# Patient Record
Sex: Male | Born: 1990 | Race: Black or African American | Hispanic: No | Marital: Single | Smoking: Never smoker
Health system: Southern US, Community
[De-identification: ages and names within clinical notes are randomized; demographics above are authoritative.]

## PROBLEM LIST (undated history)

## (undated) DIAGNOSIS — E119 Type 2 diabetes mellitus without complications: Secondary | ICD-10-CM

## (undated) DIAGNOSIS — F39 Unspecified mood [affective] disorder: Secondary | ICD-10-CM

## (undated) DIAGNOSIS — J45909 Unspecified asthma, uncomplicated: Secondary | ICD-10-CM

## (undated) HISTORY — PX: WISDOM TOOTH EXTRACTION: SHX21

---

## 2017-01-18 ENCOUNTER — Other Ambulatory Visit: Payer: Self-pay | Admitting: Student

## 2017-01-18 DIAGNOSIS — M25511 Pain in right shoulder: Secondary | ICD-10-CM

## 2017-01-18 DIAGNOSIS — R200 Anesthesia of skin: Secondary | ICD-10-CM

## 2017-01-25 ENCOUNTER — Ambulatory Visit
Admission: RE | Admit: 2017-01-25 | Discharge: 2017-01-25 | Disposition: A | Discharge status: Home / Self Care | Payer: Managed Care, Other (non HMO) | Source: Ambulatory Visit | Attending: Student | Admitting: Student

## 2017-01-25 DIAGNOSIS — R200 Anesthesia of skin: Secondary | ICD-10-CM

## 2017-01-25 DIAGNOSIS — M25511 Pain in right shoulder: Secondary | ICD-10-CM

## 2017-02-20 ENCOUNTER — Emergency Department (HOSPITAL_COMMUNITY)
Admission: EM | Admit: 2017-02-20 | Discharge: 2017-02-21 | Disposition: A | Discharge status: Home / Self Care | Payer: Managed Care, Other (non HMO) | Attending: Emergency Medicine | Admitting: Emergency Medicine

## 2017-02-20 ENCOUNTER — Encounter (HOSPITAL_COMMUNITY): Payer: Self-pay | Admitting: Family Medicine

## 2017-02-20 DIAGNOSIS — G479 Sleep disorder, unspecified: Secondary | ICD-10-CM | POA: Insufficient documentation

## 2017-02-20 DIAGNOSIS — R4589 Other symptoms and signs involving emotional state: Secondary | ICD-10-CM

## 2017-02-20 MED ORDER — HYDROXYZINE HCL 25 MG PO TABS: 25.0000 mg | ORAL_TABLET | Freq: Four times a day (QID) | ORAL | 0 refills | Status: DC

## 2017-02-20 NOTE — ED Triage Notes (Signed)
Patent is calm and cooperative. Appears in no acute distress. Answers questions appropriately.

## 2017-02-20 NOTE — ED Provider Notes (Signed)
[facility]-EMERGENCY DEPT Provider Note   CSN: [id number] Arrival date & time: 02/20/17  1845     History   Chief Complaint Chief Complaint  Patient presents with  . Anxiety    HPI Ricky [name] is a 27 y.o. male.  HPI   Ricky [name] is a 27 y.o. male, patient with no pertinent past medical history, presenting to the ED for assistance with "feeling off." States he woke up this morning feeling upset and crying.  States he has felt off all day, but this has improved as the day progressed.  He has not felt this way before.  Denies SI/HI or A/V hallucinations.  Does endorse feeling overwhelmed with extra life stressors recently, causing poor sleep.  Endorses a strong support system.  Denies regular alcohol or illicit drug use.     History reviewed. No pertinent past medical history.  There are no active problems to display for this patient.   History reviewed. No pertinent surgical history.     Home Medications    Prior to Admission medications   Medication Sig Start Date End Date Taking? Authorizing Provider  hydrOXYzine (ATARAX/VISTARIL) 25 MG tablet Take 1 tablet (25 mg total) by mouth every 6 (six) hours. 02/20/17   Jody Silas, Hillard Danker, PA-C    Family History Family History  Problem Relation Age of Onset  . Bipolar disorder Mother   . Schizophrenia Maternal Grandmother     Social History Social History   Tobacco Use  . Smoking status: Never Smoker  . Smokeless tobacco: Never Used  Substance Use Topics  . Alcohol use: No    Frequency: Never  . Drug use: No     Allergies   Patient has no known allergies.   Review of Systems Review of Systems  Neurological: Negative for dizziness, syncope, weakness, light-headedness, numbness and headaches.  Psychiatric/Behavioral: Positive for dysphoric mood and sleep disturbance. Negative for hallucinations and suicidal ideas.  All other systems reviewed and are negative.    Physical  Exam Updated Vital Signs BP (!) 153/84 (BP Location: Left Arm)   Pulse 74   Temp 98.4 F (36.9 C) (Oral)   Resp 18   Ht 5\' 11"  (1.803 m)   Wt 115.7 kg (255 lb)   SpO2 100%   BMI 35.57 kg/m   Physical Exam  Constitutional: He appears well-developed and well-nourished. No distress.  HENT:  Head: Normocephalic and atraumatic.  Eyes: Conjunctivae are normal.  Neck: Neck supple.  Cardiovascular: Normal rate and regular rhythm.  Pulmonary/Chest: Effort normal.  Neurological: He is alert.  Skin: Skin is warm and dry. He is not diaphoretic. No pallor.  Psychiatric: He has a normal mood and affect. His behavior is normal.  Patient appears calm.  Makes appropriate eye contact.  Answers questions appropriately.  Nursing note and vitals reviewed.    ED Treatments / Results  Labs (all labs ordered are listed, but only abnormal results are displayed) Labs Reviewed - No data to display  EKG  EKG Interpretation None       Radiology No results found.  Procedures Procedures (including critical care time)  Medications Ordered in ED Medications - No data to display   Initial Impression / Assessment and Plan / ED Course  I have reviewed the triage vital signs and the nursing notes.  Pertinent labs & imaging results that were available during my care of the patient were reviewed by me and considered in my medical decision making (see chart for details).  Patient presents with feelings of dysphoria today.  Denies SI.  Suspect sleep dysfunction may also be a factor.  Recommend outpatient counseling follow-up.  Resources given. The patient was given instructions for home care as well as return precautions. Patient voices understanding of these instructions, accepts the plan, and is comfortable with discharge.  Final Clinical Impressions(s) / ED Diagnoses   Final diagnoses:  Dysphoric mood  Sleep disturbance    ED Discharge Orders        Ordered    hydrOXYzine  (ATARAX/VISTARIL) 25 MG tablet  Every 6 hours     02/20/17 2349       Anselm PancoastJoy, Lazarius Rivkin C, PA-C 02/20/17 2352    Molpus, Jonny RuizJohn, MD 02/21/17 [phone]

## 2017-02-20 NOTE — Discharge Instructions (Signed)
Please follow-up with an outpatient counselor or mental health professional. Try to systematically reduce stress.  Get at least 8 hours of sleep, if possible.  Be sure to stay well-hydrated by drinking at least 72 ounces of water a day.   May use the hydroxyzine, as needed, for anxiety or to assist with sleep.  Do not drive or perform other dangerous activities while taking the hydroxyzine. Return to the ED immediately should you begin to have thoughts of suicide or self-harm.

## 2017-02-20 NOTE — ED Triage Notes (Signed)
Patient reports he woke this morning upset, crying and felt that way all day. Reports he is a little anxious. Patient denies feeling suicidal or homicidal. He reports he has had no recent change in life and reports he has a strong support system.

## 2017-04-09 ENCOUNTER — Encounter (HOSPITAL_COMMUNITY): Payer: Self-pay | Admitting: Emergency Medicine

## 2017-04-09 ENCOUNTER — Emergency Department (HOSPITAL_COMMUNITY)
Admission: EM | Admit: 2017-04-09 | Discharge: 2017-04-09 | Disposition: A | Discharge status: Home Health | Payer: Managed Care, Other (non HMO) | Attending: Emergency Medicine | Admitting: Emergency Medicine

## 2017-04-09 ENCOUNTER — Other Ambulatory Visit: Payer: Self-pay

## 2017-04-09 DIAGNOSIS — F329 Major depressive disorder, single episode, unspecified: Secondary | ICD-10-CM | POA: Insufficient documentation

## 2017-04-09 DIAGNOSIS — Z79899 Other long term (current) drug therapy: Secondary | ICD-10-CM | POA: Insufficient documentation

## 2017-04-09 DIAGNOSIS — R4589 Other symptoms and signs involving emotional state: Secondary | ICD-10-CM

## 2017-04-09 NOTE — ED Provider Notes (Signed)
[facility]-EMERGENC[facility] DEPT Provider Note   CSN: [id number] Arrival date & time: 04/09/17  0016     History   Chief Complaint Chief Complaint  Patient presents with  . Depression    HPI Ricky [name] is a 27 y.o. male.  The history is provided by the patient and medical records.  Depression     27 year old male here with depression.  He was seen here about a month ago for similar symptoms and was given outpatient resources but never followed up.  Reports his symptoms have been coming in waves of the past month or so.  States some days he is okay, other days he just feels very "down" and sad.  States there is not been anything necessarily traumatic or any added stress that he feels is contributing to this.  States he does not do so I know what to do to make himself "snap out of it".  Cousin is at bedside and states that some days he is very quiet and withdrawn but he has never made any threats to harm himself in any way.  Some days he will get in his car and just drive to try to "escapt his problems" per cousin.  Patient does not disclose and actual "problems" he feels he is having.  Patient does deny any former or current suicidal thoughts.  No homicidal ideation.  No hallucinations.  Denies any significant drug or alcohol abuse that may be contributing.  States he did try to call one of the facilities on the paperwork he was given from prior ED visit but states it was told that it would be 3 months before he get an appointment so he never made one.  States his sleep has been irregular, some days he sleeps almost all day, other days hardly at all.  States he has been able to eat and drink normally.  Patient has no documented psychiatric history.  History reviewed. No pertinent past medical history.  There are no active problems to display for this patient.   History reviewed. No pertinent surgical history.     Home Medications    Prior to Admission  medications   Medication Sig Start Date End Date Taking? Authorizing Provider  hydrOXYzine (ATARAX/VISTARIL) 25 MG tablet Take 1 tablet (25 mg total) by mouth every 6 (six) hours. Patient not taking: Reported on 04/09/2017 02/20/17   Anselm Pancoast, PA-C    Family History Family History  Problem Relation Age of Onset  . Bipolar disorder Mother   . Schizophrenia Maternal Grandmother     Social History Social History   Tobacco Use  . Smoking status: Never Smoker  . Smokeless tobacco: Never Used  Substance Use Topics  . Alcohol use: Yes    Frequency: Never    Comment: occ  . Drug use: No     Allergies   Patient has no known allergies.   Review of Systems Review of Systems  Psychiatric/Behavioral: Positive for depression.       Depression  All other systems reviewed and are negative.    Physical Exam Updated Vital Signs BP (!) 147/94 (BP Location: Left Arm)   Pulse 96   Temp 98.5 F (36.9 C) (Oral)   Resp 14   Ht 5\' 10"  (1.778 m)   Wt 113.4 kg (250 lb)   SpO2 98%   BMI 35.87 kg/m   Physical Exam  Constitutional: He is oriented to person, place, and time. He appears well-developed and well-nourished.  HENT:  Head: Normocephalic and atraumatic.  Mouth/Throat: Oropharynx is clear and moist.  Eyes: Conjunctivae and EOM are normal. Pupils are equal, round, and reactive to light.  Neck: Normal range of motion.  Cardiovascular: Normal rate, regular rhythm and normal heart sounds.  Pulmonary/Chest: Effort normal and breath sounds normal.  Abdominal: Soft. Bowel sounds are normal.  Musculoskeletal: Normal range of motion.  Neurological: He is alert and oriented to person, place, and time.  Skin: Skin is warm and dry.  Psychiatric: He has a normal mood and affect.  Depressed mood, flat affect, not very forthcoming with details Denies SI/HI/AVH  Nursing note and vitals reviewed.    ED Treatments / Results  Labs (all labs ordered are listed, but only abnormal  results are displayed) Labs Reviewed - No data to display  EKG  EKG Interpretation None       Radiology No results found.  Procedures Procedures (including critical care time)  Medications Ordered in ED Medications - No data to display   Initial Impression / Assessment and Plan / ED Course  I have reviewed the triage vital signs and the nursing notes.  Pertinent labs & imaging results that were available during my care of the patient were reviewed by me and considered in my medical decision making (see chart for details).  27 year old male here with depressive type symptoms.  Reports this is been intermittent over the past month but cannot pinpoint any provoking factors or recent events that are causing these feelings.  He denies any suicidal homicidal ideation.  No hallucinations.  Patient has no documented psychiatric history.  His symptoms are rather vague, stating some days he is sad and others he feels okay.  The only behavior his cousin seems concerned about is that he is sometimes sleeping more than normal and will get in his car and drive around.  It does not appear that he has had any attempts at self-harm.  At this time, I do not feel the patient is a danger to himself or others at this time.  I do feel he would benefit from seeing a psychiatrist and possibly being started on some medications, however do not feel he needs inpatient psychiatric care.  He was given resources for the behavioral health Hospital and encouraged to try to get evaluation later this week.  Discussed with patient as well as his cousin who remains at bedside that should symptoms worsen and he develop any suicidal homicidal ideation, hallucinations, or other alarming symptoms he should return to the ED.  Final Clinical Impressions(s) / ED Diagnoses   Final diagnoses:  Feeling of sadness    ED Discharge Orders    None       Garlon HatchetSanders, Ila Landowski M, PA-C 04/09/17 0427    Dione BoozeGlick, David, MD 04/09/17  [phone]

## 2017-04-09 NOTE — ED Notes (Signed)
ED Provider at bedside. 

## 2017-04-09 NOTE — ED Triage Notes (Signed)
Pt states he feels like he is going to have a mental breakdown  Pt states he was seen here about a month or so for same and was given some outpt resources but he has not been to see anyone  Pt states the symptoms started getting worse Friday  Pt denies SI/HI at this time

## 2017-04-09 NOTE — Discharge Instructions (Signed)
Would recommend to try and follow-up with behavioral health hospital.  If you cannot be seen there, can try some of the other residential services. If you begin to feel suicidal, homicidal, or hallucinating please return to the ED.

## 2017-04-09 NOTE — ED Notes (Signed)
Pt feels depressed/lack of motivation. Denies SI or HI.

## 2017-04-10 ENCOUNTER — Ambulatory Visit (HOSPITAL_COMMUNITY)
Admission: AD | Admit: 2017-04-10 | Discharge: 2017-04-10 | Disposition: A | Discharge status: Home / Self Care | Payer: Managed Care, Other (non HMO) | Attending: Psychiatry | Admitting: Psychiatry

## 2017-04-10 DIAGNOSIS — Z818 Family history of other mental and behavioral disorders: Secondary | ICD-10-CM | POA: Insufficient documentation

## 2017-04-10 DIAGNOSIS — F330 Major depressive disorder, recurrent, mild: Secondary | ICD-10-CM | POA: Insufficient documentation

## 2017-04-10 NOTE — H&P (Signed)
Behavioral Health Medical Screening Exam  Ricky GarrisonCameron Dragone is an 27 y.o. male. [facility] due to exacerbated depressive feelings over the last several months. The patient denies any prior use of psychotropics and or diagnosis of depression. He denies SI/SA or HI, He denies use of illicit drugs but has been drinking more frequently. He denies any acute and or chronic health concerns.  Total Time spent with patient: 20 minutes  Psychiatric Specialty Exam: Physical Exam  Constitutional: He is oriented to person, place, and time. He appears well-developed and well-nourished. No distress.  HENT:  Head: Normocephalic.  Eyes: Pupils are equal, round, and reactive to light.  Respiratory: Effort normal and breath sounds normal. No respiratory distress.  Neurological: He is alert and oriented to person, place, and time. No cranial nerve deficit.  Skin: Skin is warm and dry. He is not diaphoretic.  Psychiatric: His speech is normal. Judgment normal. He is withdrawn. Cognition and memory are normal. He exhibits a depressed mood. He expresses no homicidal and no suicidal ideation.    Review of Systems  Constitutional: Negative for chills, diaphoresis, fever, malaise/fatigue and weight loss.  Respiratory: Negative for cough and shortness of breath.   Cardiovascular: Negative for chest pain.  Neurological: Negative for seizures and loss of consciousness.  Psychiatric/Behavioral: Positive for depression. Negative for hallucinations, substance abuse and suicidal ideas. The patient is nervous/anxious.   All other systems reviewed and are negative.   There were no vitals taken for this visit.There is no height or weight on file to calculate BMI.  General Appearance: Casual  Eye Contact:  Fair  Speech:  Clear and Coherent  Volume:  Decreased  Mood:  Depressed  Affect:  Congruent  Thought Process:  Goal Directed  Orientation:  Full (Time, Place, and Person)  Thought Content:   Logical  Suicidal Thoughts:  No  Homicidal Thoughts:  No  Memory:  Immediate;   Good  Judgement:  Fair  Insight:  Fair  Psychomotor Activity:  Normal  Concentration: Concentration: Fair  Recall:  Fair  Fund of Knowledge:Fair  Language: Good  Akathisia:  Negative  Handed:  Right  AIMS (if indicated):     Assets:  Desire for Improvement  Sleep:       Musculoskeletal: Strength & Muscle Tone: within normal limits Gait & Station: normal Patient leans: N/A  There were no vitals taken for this visit.  Recommendations:  Based on my evaluation the patient does not appear to have an emergency medical condition.  Kerry HoughSpencer E Olaoluwa Grieder, PA-C 04/10/2017, 5:16 AM

## 2017-04-10 NOTE — BH Assessment (Signed)
Assessment Note  Ricky [name] is an 27 y.o. male who presents voluntarily to [facility] alone reporting symptoms of depression.  Pt reports feeling down for the past 3 months. Pt has a history of suicidal thoughts as a teenager. Pt denies being on any medications.  Pt denies current suicidal and denies having a plan. Pt denies past attempts.   Pt acknowledges symptoms of depression including: sadness, fatigue, low self esteem, tearfulness, isolating, lack of motivation, anger, irritability, negative outlook, difficulty concentrating, helplessness and hopelessness. and occasional nightmares.  Pt also reports symptoms of anxiety including: excessive worry, restlessness and having a panic attack about 1 month ago.  Pt denies homicidal ideation/ history of violence. Pt denies auditory or visual hallucinations or other psychotic symptoms. Pt denies having any current stressors include.   Pt lives with a roomate, and supports include family and friends. Pt denies history of abuse and trauma. Pt reports there is a family history of MH and denies family history of SI/SA. Pt states he is currently enrolled in college and is employed. Pt has fair insight and partial judgment. Pt's memory is intact.  Pt denies having a legal history.  Pt denies OP/IP history  Pt reports alcohol use last week and substance use of marijuana 2 years ago.  Pt is casually dressed, alert, oriented x4 with normal, slow and soft speech and normal motor behavior. Eye contact is fair. Pt's mood is depressed and affect is depressed and flat. Affect is congruent with mood. Thought process is coherent and relevant. There is no indication Pt is currently responding to internal stimuli or experiencing delusional thought content. Pt was cooperative throughout assessment. Pt is able to contract for safety outside the hospital.  Diagnosis: F33.0 Major depressive disorder, Recurrent episode, Mild  Past Medical History: No past medical history  on file.  No past surgical history on file.  Family History:  Family History  Problem Relation Age of Onset  . Bipolar disorder Mother   . Schizophrenia Maternal Grandmother     Social History:  reports that  has never smoked. he has never used smokeless tobacco. He reports that he drinks alcohol. He reports that he does not use drugs.  Additional Social History:  Alcohol / Drug Use Pain Medications: See MAR Prescriptions: See MAR Over the Counter: See MAR History of alcohol / drug use?: Yes Substance #1 Name of Substance 1: Marijuana 1 - Age of First Use: 14 1 - Amount (size/oz): Unknown 1 - Frequency: Stopped 1 - Duration: Stopped 2 years ago 1 - Last Use / Amount: 2 years ago, unknown amount Substance #2 Name of Substance 2: Alcohol 2 - Age of First Use: 20 2 - Amount (size/oz): Unknown 2 - Frequency: Occassionally 2 - Duration: Ongoing 2 - Last Use / Amount: Last Thursday  CIWA:   COWS:    Allergies: No Known Allergies  Home Medications:  (Not in a hospital admission)  OB/GYN Status:  No LMP for male patient.  General Assessment Data Location of Assessment: [facility] Assessment Services TTS Assessment: In system Is this a Tele or Face-to-Face Assessment?: Face-to-Face Is this an Initial Assessment or a Re-assessment for this encounter?: Initial Assessment Marital status: Single Maiden name: NA Is patient pregnant?: No Pregnancy Status: No Living Arrangements: Non-relatives/Friends(Pt shares apartment with roomate) Can pt return to current living arrangement?: Yes Admission Status: Voluntary Is patient capable of signing voluntary admission?: Yes Referral Source: Self/Family/Friend Insurance type: Medical sales representative     Crisis Care Plan Living Arrangements:  Non-relatives/Friends(Pt shares apartment with roomate)  Education Status Is patient currently in school?: Yes Current Grade: [employer] Senior  Risk to self with the past 6 months Suicidal Ideation: No Has  patient been a risk to self within the past 6 months prior to admission? : No Suicidal Intent: No Has patient had any suicidal intent within the past 6 months prior to admission? : No Is patient at risk for suicide?: No Suicidal Plan?: No Has patient had any suicidal plan within the past 6 months prior to admission? : No Access to Means: No What has been your use of drugs/alcohol within the last 12 months?: Pt reports using alcohol last Thursday Previous Attempts/Gestures: No How many times?: 0 Other Self Harm Risks: Pt denies Triggers for Past Attempts: None known Intentional Self Injurious Behavior: None Family Suicide History: No Recent stressful life event(s): Other (Comment)(Pt denies) Persecutory voices/beliefs?: No Depression: Yes Depression Symptoms: Despondent, Insomnia, Tearfulness, Isolating, Fatigue, Loss of interest in usual pleasures, Feeling worthless/self pity, Feeling angry/irritable Substance abuse history and/or treatment for substance abuse?: Yes Suicide prevention information given to non-admitted patients: Yes  Risk to Others within the past 6 months Homicidal Ideation: No Does patient have any lifetime risk of violence toward others beyond the six months prior to admission? : No Thoughts of Harm to Others: No Current Homicidal Intent: No Current Homicidal Plan: No Access to Homicidal Means: No Identified Victim: Pt denies History of harm to others?: No Assessment of Violence: None Noted Violent Behavior Description: Pt denies Does patient have access to weapons?: No Criminal Charges Pending?: No Does patient have a court date: No Is patient on probation?: No  Psychosis Hallucinations: None noted Delusions: None noted  Mental Status Report Appearance/Hygiene: Unremarkable Eye Contact: Fair Motor Activity: Freedom of movement Speech: Logical/coherent, Soft, Slow Level of Consciousness: Alert, Quiet/awake Mood: Depressed Affect: Depressed,  Flat Anxiety Level: Minimal Thought Processes: Coherent, Relevant Judgement: Partial Orientation: Person, Place, Time, Situation, Appropriate for developmental age Obsessive Compulsive Thoughts/Behaviors: None  Cognitive Functioning Concentration: Normal Memory: Recent Intact, Remote Intact IQ: Average Insight: Fair Impulse Control: Fair Appetite: Good Weight Loss: 0 Weight Gain: 0 Sleep: Decreased Total Hours of Sleep: 4 Vegetative Symptoms: Staying in bed  ADLScreening [facility] Assessment [facility]) Patient's cognitive ability adequate to safely complete daily activities?: Yes Patient able to express need for assistance with ADLs?: Yes Independently performs ADLs?: Yes (appropriate for developmental age)  Prior Inpatient Therapy Prior Inpatient Therapy: No  Prior Outpatient Therapy Prior Outpatient Therapy: No Does patient have an ACCT team?: No Does patient have Intensive In-House Services?  : No Does patient have [facility] services? : No Does patient have P4CC services?: No  ADL Screening (condition at time of admission) Patient's cognitive ability adequate to safely complete daily activities?: Yes Is the patient deaf or have difficulty hearing?: No Does the patient have difficulty seeing, even when wearing glasses/contacts?: No Does the patient have difficulty concentrating, remembering, or making decisions?: No Patient able to express need for assistance with ADLs?: Yes Does the patient have difficulty dressing or bathing?: No Independently performs ADLs?: Yes (appropriate for developmental age) Does the patient have difficulty walking or climbing stairs?: No Weakness of Legs: None Weakness of Arms/Hands: None  Home Assistive Devices/Equipment Home Assistive Devices/Equipment: None    Abuse/Neglect Assessment (Assessment to be complete while patient is alone) Abuse/Neglect Assessment Can Be Completed: Yes Physical Abuse: Denies Verbal Abuse: Denies Sexual Abuse:  Denies Exploitation of patient/patient's resources: Denies Self-Neglect: Denies     Advance  Directives (For Healthcare) Does Patient Have a Medical Advance Directive?: No Would patient like information on creating a medical advance directive?: No - Patient declined    Additional Information 1:1 In Past 12 Months?: No CIRT Risk: No Elopement Risk: No Does patient have medical clearance?: No     Disposition: Gave clinical report to Donell Sievert, PA who recommends discharge with OPT resources.  Disposition Initial Assessment Completed for this Encounter: Yes Disposition of Patient: Discharge with Outpatient Resources  On Site Evaluation by:   Reviewed with Physician:    Annamaria Boots, MS, [facility] Therapeutic Triage Specialist  Annamaria Boots 04/10/2017 5:19 AM

## 2019-11-10 ENCOUNTER — Emergency Department (HOSPITAL_BASED_OUTPATIENT_CLINIC_OR_DEPARTMENT_OTHER)
Admission: EM | Admit: 2019-11-10 | Discharge: 2019-11-10 | Disposition: A | Discharge status: Home / Self Care | Payer: Self-pay | Attending: Emergency Medicine | Admitting: Emergency Medicine

## 2019-11-10 ENCOUNTER — Encounter (HOSPITAL_BASED_OUTPATIENT_CLINIC_OR_DEPARTMENT_OTHER): Payer: Self-pay

## 2019-11-10 ENCOUNTER — Other Ambulatory Visit: Payer: Self-pay

## 2019-11-10 DIAGNOSIS — Z20822 Contact with and (suspected) exposure to covid-19: Secondary | ICD-10-CM | POA: Insufficient documentation

## 2019-11-10 DIAGNOSIS — L539 Erythematous condition, unspecified: Secondary | ICD-10-CM | POA: Insufficient documentation

## 2019-11-10 DIAGNOSIS — J45909 Unspecified asthma, uncomplicated: Secondary | ICD-10-CM | POA: Insufficient documentation

## 2019-11-10 DIAGNOSIS — J029 Acute pharyngitis, unspecified: Secondary | ICD-10-CM | POA: Insufficient documentation

## 2019-11-10 HISTORY — DX: Unspecified asthma, uncomplicated: J45.909

## 2019-11-10 LAB — GROUP A STREP BY PCR: Group A Strep by PCR: NOT DETECTED

## 2019-11-10 MED ORDER — LIDOCAINE VISCOUS HCL 2 % MT SOLN
15.0000 mL | Freq: Once | OROMUCOSAL | Status: AC
  Administered 2019-11-10: 15 mL via OROMUCOSAL
  Filled 2019-11-10: qty 15

## 2019-11-10 MED ORDER — IBUPROFEN 400 MG PO TABS
600.0000 mg | ORAL_TABLET | Freq: Once | ORAL | Status: AC
  Administered 2019-11-10: 18:00:00 600 mg via ORAL
  Filled 2019-11-10: qty 1

## 2019-11-10 NOTE — ED Provider Notes (Signed)
[facility] EMERGENCY DEPARTMENT Provider Note   CSN: [id number] Arrival date & time: 11/10/19  1514     History Chief Complaint  Patient presents with  . Sore Throat    Robbie Rideaux is a 29 y.o. male.  Latavion Halls is a 29 y.o. male with a history of asthma, who presents to the emergency department for evaluation of sore throat which has been present for a little over a week.  He states that he was seen at urgent care last week after symptoms initially began and had a negative Covid test, no other testing was done.  He has continued to have a sore throat that is worse with swallowing, but he has been able to eat and drink normally.  He denies any associated rhinorrhea, cough, chest pain, shortness of breath.  He states that he had some brief ear pain today but thought that that was more so due to wearing his mask.  No fevers, chills, myalgias, headache, nausea, vomiting, diarrhea or abdominal pain.  No known sick contacts.  He has been using many over-the-counter cold and flu medications, the only thing that has given him much relief is ibuprofen.        Past Medical History:  Diagnosis Date  . Asthma     There are no problems to display for this patient.   Past Surgical History:  Procedure Laterality Date  . WISDOM TOOTH EXTRACTION         Family History  Problem Relation Age of Onset  . Bipolar disorder Mother   . Schizophrenia Maternal Grandmother     Social History   Tobacco Use  . Smoking status: Never Smoker  . Smokeless tobacco: Never Used  Vaping Use  . Vaping Use: Never used  Substance Use Topics  . Alcohol use: Yes    Comment: occ  . Drug use: No    Home Medications Prior to Admission medications   Medication Sig Start Date End Date Taking? Authorizing Provider  hydrOXYzine (ATARAX/VISTARIL) 25 MG tablet Take 1 tablet (25 mg total) by mouth every 6 (six) hours. Patient not taking: Reported on 04/09/2017 02/20/17   Anselm Pancoast, PA-C    Allergies    Patient has no known allergies.  Review of Systems   Review of Systems  Constitutional: Negative for chills and fever.  HENT: Positive for sore throat. Negative for congestion, rhinorrhea, trouble swallowing and voice change.   Respiratory: Negative for cough and shortness of breath.   Cardiovascular: Negative for chest pain.  Gastrointestinal: Negative for abdominal pain, diarrhea, nausea and vomiting.  Musculoskeletal: Negative for myalgias, neck pain and neck stiffness.  Neurological: Negative for headaches.  All other systems reviewed and are negative.   Physical Exam Updated Vital Signs BP (!) 139/94 (BP Location: Left Arm)   Pulse 100   Temp 98.3 F (36.8 C) (Oral)   Resp 15   Ht 5\' 10"  (1.778 m)   Wt 112 kg   SpO2 100%   BMI 35.44 kg/m   Physical Exam Vitals and nursing note reviewed.  Constitutional:      General: He is not in acute distress.    Appearance: He is well-developed. He is obese. He is not ill-appearing or diaphoretic.     Comments: Well-appearing and in no distress  HENT:     Head: Normocephalic and atraumatic.     Right Ear: Tympanic membrane and ear canal normal.     Left Ear: Tympanic membrane and ear canal  normal.     Nose: Congestion present. No rhinorrhea.     Comments: Mild congestion noted without rhinorrhea    Mouth/Throat:     Mouth: Mucous membranes are moist.     Pharynx: Posterior oropharyngeal erythema present.     Tonsils: No tonsillar exudate or tonsillar abscesses.     Comments: Posterior oropharynx clear and mucous membranes moist, there is mild erythema but no edema or tonsillar exudates, uvula midline, normal phonation, no trismus, tolerating secretions without difficulty. Eyes:     General:        Right eye: No discharge.        Left eye: No discharge.  Neck:     Comments: No rigidity Cardiovascular:     Rate and Rhythm: Normal rate and regular rhythm.     Heart sounds: Normal heart sounds.    Pulmonary:     Effort: Pulmonary effort is normal. No respiratory distress.     Breath sounds: Normal breath sounds.     Comments: Respirations equal and unlabored, patient able to speak in full sentences, lungs clear to auscultation bilaterally Abdominal:     General: Bowel sounds are normal. There is no distension.     Palpations: Abdomen is soft. There is no mass.     Tenderness: There is no abdominal tenderness. There is no guarding.     Comments: Abdomen soft, nondistended, nontender to palpation in all quadrants without guarding or peritoneal signs  Musculoskeletal:        General: No deformity.     Cervical back: Neck supple.  Lymphadenopathy:     Cervical: No cervical adenopathy.  Skin:    General: Skin is warm and dry.     Capillary Refill: Capillary refill takes less than 2 seconds.  Neurological:     Mental Status: He is alert and oriented to person, place, and time.  Psychiatric:        Mood and Affect: Mood normal.        Behavior: Behavior normal.     ED Results / Procedures / Treatments   Labs (all labs ordered are listed, but only abnormal results are displayed) Labs Reviewed  GROUP A STREP BY PCR    EKG None  Radiology No results found.  Procedures Procedures (including critical care time)  Medications Ordered in ED Medications  ibuprofen (ADVIL) tablet 600 mg (has no administration in time range)  lidocaine (XYLOCAINE) 2 % viscous mouth solution 15 mL (has no administration in time range)    ED Course  I have reviewed the triage vital signs and the nursing notes.  Pertinent labs & imaging results that were available during my care of the patient were reviewed by me and considered in my medical decision making (see chart for details).    MDM Rules/Calculators/A&P                          Pt afebrile without tonsillar exudate, negative strep. Presents with mild cervical lymphadenopathy, & dysphagia; diagnosis of viral pharyngitis.  Had  negative Covid test at urgent care.  No abx indicated. Discharged with symptomatic tx for pain  Pt does not appear dehydrated, but did discuss importance of water rehydration. Presentation non concerning for PTA or RPA. No trismus or uvula deviation. Specific return precautions discussed. Pt able to drink water in ED without difficulty with intact air way. Recommended PCP follow up.  Final Clinical Impression(s) / ED Diagnoses Final diagnoses:  Viral pharyngitis  Rx / DC Orders ED Discharge Orders    None       Dartha Lodge, [place] 11/10/19 1818    Rolan Bucco, MD 11/10/19 2342

## 2019-11-10 NOTE — ED Triage Notes (Signed)
Painful swallowing/sore throat x 1 week, seen at [facility] on Tuesday, tested negative for COVID-19, no other tests done. No fever, congestion/runny nose, or body aches. Still has tonsils.

## 2019-11-10 NOTE — Discharge Instructions (Signed)
Your strep test was negative, suspect symptoms are due to viral upper respiratory infection.  You can treat symptoms with ibuprofen, Tylenol and over-the-counter's epical throat lozenges.  Make sure you are staying very well-hydrated.  If symptoms or not improving or you develop new or worsening symptoms return or follow-up with your PCP for reevaluation.

## 2021-03-17 DIAGNOSIS — E1165 Type 2 diabetes mellitus with hyperglycemia: Secondary | ICD-10-CM | POA: Insufficient documentation

## 2021-05-30 ENCOUNTER — Other Ambulatory Visit: Payer: Self-pay

## 2021-05-30 ENCOUNTER — Emergency Department (HOSPITAL_BASED_OUTPATIENT_CLINIC_OR_DEPARTMENT_OTHER)
Admission: EM | Admit: 2021-05-30 | Discharge: 2021-05-30 | Disposition: A | Discharge status: Home / Self Care | Payer: 59 | Attending: Emergency Medicine | Admitting: Emergency Medicine

## 2021-05-30 ENCOUNTER — Encounter (HOSPITAL_BASED_OUTPATIENT_CLINIC_OR_DEPARTMENT_OTHER): Payer: Self-pay | Admitting: Emergency Medicine

## 2021-05-30 ENCOUNTER — Emergency Department (HOSPITAL_BASED_OUTPATIENT_CLINIC_OR_DEPARTMENT_OTHER): Payer: 59

## 2021-05-30 DIAGNOSIS — E1165 Type 2 diabetes mellitus with hyperglycemia: Secondary | ICD-10-CM | POA: Diagnosis not present

## 2021-05-30 DIAGNOSIS — J4541 Moderate persistent asthma with (acute) exacerbation: Secondary | ICD-10-CM | POA: Insufficient documentation

## 2021-05-30 DIAGNOSIS — R0789 Other chest pain: Secondary | ICD-10-CM | POA: Diagnosis present

## 2021-05-30 HISTORY — DX: Type 2 diabetes mellitus without complications: E11.9

## 2021-05-30 LAB — CBC WITH DIFFERENTIAL/PLATELET
Abs Immature Granulocytes: 0.02 10*3/uL (ref 0.00–0.07)
Basophils Absolute: 0 10*3/uL (ref 0.0–0.1)
Basophils Relative: 1 %
Eosinophils Absolute: 0.1 10*3/uL (ref 0.0–0.5)
Eosinophils Relative: 1 %
HCT: 41.1 % (ref 39.0–52.0)
Hemoglobin: 13.7 g/dL (ref 13.0–17.0)
Immature Granulocytes: 0 %
Lymphocytes Relative: 45 %
Lymphs Abs: 3.6 10*3/uL (ref 0.7–4.0)
MCH: 27 pg (ref 26.0–34.0)
MCHC: 33.3 g/dL (ref 30.0–36.0)
MCV: 81.1 fL (ref 80.0–100.0)
Monocytes Absolute: 0.5 10*3/uL (ref 0.1–1.0)
Monocytes Relative: 6 %
Neutro Abs: 3.8 10*3/uL (ref 1.7–7.7)
Neutrophils Relative %: 47 %
Platelets: 330 10*3/uL (ref 150–400)
RBC: 5.07 MIL/uL (ref 4.22–5.81)
RDW: 13.2 % (ref 11.5–15.5)
WBC: 7.9 10*3/uL (ref 4.0–10.5)
nRBC: 0 % (ref 0.0–0.2)

## 2021-05-30 LAB — BASIC METABOLIC PANEL
Anion gap: 10 (ref 5–15)
BUN: 11 mg/dL (ref 6–20)
CO2: 28 mmol/L (ref 22–32)
Calcium: 9.7 mg/dL (ref 8.9–10.3)
Chloride: 96 mmol/L — ABNORMAL LOW (ref 98–111)
Creatinine, Ser: 0.95 mg/dL (ref 0.61–1.24)
GFR, Estimated: >60 mL/min (ref >60)
Glucose, Bld: 436 mg/dL — ABNORMAL HIGH (ref 70–99)
Potassium: 3.5 mmol/L (ref 3.5–5.1)
Sodium: 134 mmol/L — ABNORMAL LOW (ref 135–145)

## 2021-05-30 LAB — CBG MONITORING, ED
Glucose-Capillary: 332 mg/dL — ABNORMAL HIGH (ref 70–99)
Glucose-Capillary: 250 mg/dL — ABNORMAL HIGH (ref 70–99)

## 2021-05-30 LAB — TROPONIN I (HIGH SENSITIVITY)
Troponin I (High Sensitivity): 3 ng/L (ref <18)
Troponin I (High Sensitivity): 3 ng/L (ref <18)

## 2021-05-30 MED ORDER — INSULIN ASPART 100 UNIT/ML IJ SOLN
5.0000 [IU] | Freq: Once | INTRAMUSCULAR | Status: AC
  Administered 2021-05-30: 5 [IU] via SUBCUTANEOUS

## 2021-05-30 MED ORDER — ALBUTEROL SULFATE HFA 108 (90 BASE) MCG/ACT IN AERS
2.0000 | INHALATION_SPRAY | RESPIRATORY_TRACT | Status: DC | PRN
Start: 2021-05-30 — End: 2021-05-30
  Administered 2021-05-30: 2 via RESPIRATORY_TRACT
  Filled 2021-05-30: qty 6.7

## 2021-05-30 MED ORDER — DEXAMETHASONE 4 MG PO TABS
10.0000 mg | ORAL_TABLET | Freq: Once | ORAL | Status: AC
  Administered 2021-05-30: 10 mg via ORAL
  Filled 2021-05-30: qty 3

## 2021-05-30 MED ORDER — SODIUM CHLORIDE 0.9 % IV BOLUS
1000.0000 mL | Freq: Once | INTRAVENOUS | Status: AC
  Administered 2021-05-30: 1000 mL via INTRAVENOUS

## 2021-05-30 MED ORDER — INSULIN REGULAR HUMAN 100 UNIT/ML IJ SOLN: 5.0000 [IU] | Freq: Once | INTRAMUSCULAR | Status: DC

## 2021-05-30 MED ORDER — ASPIRIN 81 MG PO CHEW
324.0000 mg | CHEWABLE_TABLET | Freq: Once | ORAL | Status: AC
  Administered 2021-05-30: 324 mg via ORAL
  Filled 2021-05-30: qty 4

## 2021-05-30 MED ORDER — INSULIN ASPART 100 UNIT/ML IJ SOLN
10.0000 [IU] | Freq: Once | INTRAMUSCULAR | Status: AC
Start: 2021-05-30 — End: 2021-05-30
  Administered 2021-05-30: 10 [IU] via SUBCUTANEOUS

## 2021-05-30 NOTE — ED Notes (Signed)
Pt ambulatory with steady gait to restroom 

## 2021-05-30 NOTE — ED Provider Notes (Signed)
? ?[facility]-EMERGENCY DEPT ?Provider Note: Ricky Dell, MD, FACEP ? ?CSN: [id number] ?MRN: [id number] ?ARRIVAL: 05/30/21 at 0521 ?ROOM: [facility]09/[facility]09 ? ? ?CHIEF COMPLAINT  ?Chest Pain ? ? ?HISTORY OF PRESENT ILLNESS  ?05/30/21 5:38 AM ?Ricky [name] is a 31 y.o. male with a history of asthma as a child and diabetes as an adult.  He is here with 2 to 3 days of chest discomfort.  The chest discomfort is vaguely characterized but is described as like a pressure in his anterior chest or a sensation that he is having trouble taking in a deep breath.  He has not been wheezing.  There is no associated diaphoresis or nausea.  He rates his discomfort at a 4 out of 10 at its worst.  Nothing makes it better or worse and it occurs intermittently, often at rest. ? ? ?Past Medical History:  ?Diagnosis Date  ? Asthma   ? Diabetes mellitus without complication (HCC)   ? ? ?Past Surgical History:  ?Procedure Laterality Date  ? WISDOM TOOTH EXTRACTION    ? ? ?Family History  ?Problem Relation Age of Onset  ? Bipolar disorder Mother   ? CAD Mother   ? CAD Father   ? Schizophrenia Maternal Grandmother   ? CAD Other   ? ? ?Social History  ? ?Tobacco Use  ? Smoking status: Never  ? Smokeless tobacco: Never  ?Vaping Use  ? Vaping Use: Never used  ?Substance Use Topics  ? Alcohol use: Not Currently  ?  Comment: occ  ? Drug use: No  ? ? ?Prior to Admission medications   ?Medication Sig Start Date End Date Taking? Authorizing Provider  ?hydrOXYzine (ATARAX/VISTARIL) 25 MG tablet Take 1 tablet (25 mg total) by mouth every 6 (six) hours. ?Patient not taking: Reported on 04/09/2017 02/20/17   Anselm Pancoast, PA-C  ? ? ?Allergies ?Patient has no known allergies. ? ? ?REVIEW OF SYSTEMS  ?Negative except as noted here or in the History of Present Illness. ? ? ?PHYSICAL EXAMINATION  ?Initial Vital Signs ?Blood pressure (!) 143/78, pulse 94, temperature 98.2 ?F (36.8 ?C), temperature source Oral, resp. rate 16, height 5\' 11"  (1.803 m), weight 108 kg, SpO2  100 %. ? ?Examination ?General: Well-developed, well-nourished male in no acute distress; appearance consistent with age of record ?HENT: normocephalic; atraumatic ?Eyes: Normal appearance ?Neck: supple ?Heart: regular rate and rhythm ?Lungs: clear to auscultation bilaterally ?Abdomen: soft; nondistended; nontender; bowel sounds present ?Extremities: No deformity; full range of motion; pulses normal ?Neurologic: Awake, alert and oriented; motor function intact in all extremities and symmetric; no facial droop ?Skin: Warm and dry ?Psychiatric: Anxious ? ? ?RESULTS  ?Summary of this visit's results, reviewed and interpreted by myself: ? ? EKG Interpretation ? ?Date/Time:  Monday May 30 2021 05:30:25 EDT ?Ventricular Rate:  97 ?PR Interval:  177 ?QRS Duration: 84 ?QT Interval:  324 ?QTC Calculation: 412 ?R Axis:   67 ?Text Interpretation: Sinus rhythm Normal ECG No previous ECGs available Confirmed by Mischele Detter (04-28-2006) on 05/30/2021 5:32:18 AM ?  ? ?  ? ?Laboratory Studies: ?Results for orders placed or performed during the hospital encounter of 05/30/21 (from the past 24 hour(s))  ?CBC with Differential/Platelet     Status: None  ? Collection Time: 05/30/21  5:47 AM  ?Result Value Ref Range  ? WBC 7.9 4.0 - 10.5 K/uL  ? RBC 5.07 4.22 - 5.81 MIL/uL  ? Hemoglobin 13.7 13.0 - 17.0 g/dL  ? HCT 41.1 39.0 - 52.0 %  ?  MCV 81.1 80.0 - 100.0 fL  ? MCH 27.0 26.0 - 34.0 pg  ? MCHC 33.3 30.0 - 36.0 g/dL  ? RDW 13.2 11.5 - 15.5 %  ? Platelets 330 150 - 400 K/uL  ? nRBC 0.0 0.0 - 0.2 %  ? Neutrophils Relative % 47 %  ? Neutro Abs 3.8 1.7 - 7.7 K/uL  ? Lymphocytes Relative 45 %  ? Lymphs Abs 3.6 0.7 - 4.0 K/uL  ? Monocytes Relative 6 %  ? Monocytes Absolute 0.5 0.1 - 1.0 K/uL  ? Eosinophils Relative 1 %  ? Eosinophils Absolute 0.1 0.0 - 0.5 K/uL  ? Basophils Relative 1 %  ? Basophils Absolute 0.0 0.0 - 0.1 K/uL  ? Immature Granulocytes 0 %  ? Abs Immature Granulocytes 0.02 0.00 - 0.07 K/uL  ?Basic metabolic panel     Status:  Abnormal  ? Collection Time: 05/30/21  5:47 AM  ?Result Value Ref Range  ? Sodium 134 (L) 135 - 145 mmol/L  ? Potassium 3.5 3.5 - 5.1 mmol/L  ? Chloride 96 (L) 98 - 111 mmol/L  ? CO2 28 22 - 32 mmol/L  ? Glucose, Bld 436 (H) 70 - 99 mg/dL  ? BUN 11 6 - 20 mg/dL  ? Creatinine, Ser 0.95 0.61 - 1.24 mg/dL  ? Calcium 9.7 8.9 - 10.3 mg/dL  ? GFR, Estimated >60 >60 mL/min  ? Anion gap 10 5 - 15  ?Troponin I (High Sensitivity)     Status: None  ? Collection Time: 05/30/21  5:47 AM  ?Result Value Ref Range  ? Troponin I (High Sensitivity) 3 <18 ng/L  ?CBG monitoring, ED     Status: Abnormal  ? Collection Time: 05/30/21  8:00 AM  ?Result Value Ref Range  ? Glucose-Capillary 332 (H) 70 - 99 mg/dL  ?Troponin I (High Sensitivity)     Status: None  ? Collection Time: 05/30/21  8:01 AM  ?Result Value Ref Range  ? Troponin I (High Sensitivity) 3 <18 ng/L  ?CBG monitoring, ED     Status: Abnormal  ? Collection Time: 05/30/21  9:20 AM  ?Result Value Ref Range  ? Glucose-Capillary 250 (H) 70 - 99 mg/dL  ? ?Imaging Studies: ?DG Chest 2 View ? ?Result Date: 05/30/2021 ?CLINICAL DATA:  31 year old male with history of chest discomfort. EXAM: CHEST - 2 VIEW COMPARISON:  No priors. FINDINGS: Lung volumes are normal. No consolidative airspace disease. No pleural effusions. No pneumothorax. No pulmonary nodule or mass noted. Pulmonary vasculature and the cardiomediastinal silhouette are within normal limits. IMPRESSION: No radiographic evidence of acute cardiopulmonary disease. Electronically Signed   By: Trudie Reedaniel  Entrikin M.D.   On: 05/30/2021 06:19   ? ?ED COURSE and MDM  ?Nursing notes, initial and subsequent vitals signs, including pulse oximetry, reviewed and interpreted by myself. ? ?Vitals:  ? 05/30/21 0733 05/30/21 0830 05/30/21 0914 05/30/21 0950  ?BP:  119/78 119/84 118/74  ?Pulse:  88 84 88  ?Resp: 18 18 18 16   ?Temp:      ?TempSrc:      ?SpO2:  100% 100% 99%  ?Weight:      ?Height:      ? ?Medications  ?aspirin chewable tablet 324  mg (324 mg Oral Given 05/30/21 0617)  ?sodium chloride 0.9 % bolus 1,000 mL ( Intravenous Stopped 05/30/21 0731)  ?insulin aspart (novoLOG) injection 10 Units (10 Units Subcutaneous Given 05/30/21 0630)  ?sodium chloride 0.9 % bolus 1,000 mL (0 mLs Intravenous Stopped 05/30/21 0921)  ?dexamethasone (  DECADRON) tablet 10 mg (10 mg Oral Given 05/30/21 0945)  ?insulin aspart (novoLOG) injection 5 Units (5 Units Subcutaneous Given 05/30/21 0945)  ? ?6:25 AM ?Initial troponin is 3.  Glucose is greater than 400.  We will administer IV fluids and subcu insulin.  Patient's breathing feeling better after albuterol administration.  He was given an albuterol inhaler and AeroChamber and instructed in their use.  ? ?7:00 AM ?Signed out to Dr. Adela Lank. Second troponin pending. ? ? ?PROCEDURES  ?Procedures ? ? ?ED DIAGNOSES  ? ?  ICD-10-CM   ?1. Moderate persistent asthma with exacerbation  J45.41   ?  ? ? ? ?  ?Paula Libra, MD ?05/30/21 2235 ? ?

## 2021-05-30 NOTE — ED Notes (Signed)
Cbg 332 ?

## 2021-05-30 NOTE — ED Triage Notes (Signed)
Pt is c/o chest pain for the past 2-3 days  Pt states the pain is in the middle of his chest and is a dull ache but is constant and makes it feel like it is hard to breathe   Pt states it happens every now and then    ?

## 2021-05-30 NOTE — Discharge Instructions (Signed)
Your blood sugar was very high today.  Your doctor may need to consider starting you on some different medications.  Please call them today and let them know about your visit. ? ?Your blood work did not show that you are having an acute heart attack.  This is not 100%.  If your symptoms worsen then please receive care.  You can use your inhaler up to every 4 hours while awake 4 puffs if you need to use it more often than not then please return for repeat evaluation. ? ? ?

## 2021-05-30 NOTE — ED Provider Notes (Signed)
31 year old male I received in signout from Dr. Read Drivers, briefly the patient had been experiencing some atypical chest discomfort improved with albuterol plan for delta troponin.  Of note the patient was also significantly hyperglycemic without anion gap or acidosis.  Given IV fluids with plan for reassessment. ? ?After 2 L of fluids the patient's blood sugar has trended down to 250.  I reviewed his chart and he is on metformin only.  We will give a small bolus dose of insulin here.  Have him follow-up with his family doctor. ? ?With him having such improvement with albuterol we will give 1 dose of Decadron here. ? ? ?  ?Melene Plan, DO ?05/30/21 [phone] ? ?

## 2021-05-30 NOTE — ED Notes (Signed)
Patient transported to X-ray 

## 2022-06-27 DIAGNOSIS — E785 Hyperlipidemia, unspecified: Secondary | ICD-10-CM | POA: Insufficient documentation

## 2022-06-27 DIAGNOSIS — E782 Mixed hyperlipidemia: Secondary | ICD-10-CM | POA: Insufficient documentation

## 2022-06-27 DIAGNOSIS — E781 Pure hyperglyceridemia: Secondary | ICD-10-CM | POA: Insufficient documentation

## 2023-07-11 ENCOUNTER — Encounter: Payer: Self-pay | Admitting: Family Medicine

## 2023-07-11 ENCOUNTER — Ambulatory Visit: Admitting: Family Medicine

## 2023-07-11 VITALS — BP 129/79 | HR 96 | Temp 98.2°F | Resp 18 | Ht 71.0 in | Wt 247.1 lb

## 2023-07-11 DIAGNOSIS — Z794 Long term (current) use of insulin: Secondary | ICD-10-CM | POA: Diagnosis not present

## 2023-07-11 DIAGNOSIS — Z7689 Persons encountering health services in other specified circumstances: Secondary | ICD-10-CM

## 2023-07-11 DIAGNOSIS — E1165 Type 2 diabetes mellitus with hyperglycemia: Secondary | ICD-10-CM

## 2023-07-11 DIAGNOSIS — F332 Major depressive disorder, recurrent severe without psychotic features: Secondary | ICD-10-CM | POA: Diagnosis not present

## 2023-07-11 MED ORDER — LAMOTRIGINE 25 MG PO TABS: ORAL_TABLET | ORAL | 0 refills | Status: DC

## 2023-07-11 NOTE — Progress Notes (Signed)
 New Patient Office Visit  Subjective    Patient ID: Ricky [name], male    DOB: 1990/11/30  Age: 33 y.o. MRN: [id number]  CC:  Chief Complaint  Patient presents with   Establish Care    Patient is here to establish care with a new PCP, Patient would like to discuss mental health. Patient states that his depression and anxiety have gotten worse within the past 2 weeks    HPI Ricky [name] presents to establish care. Pt is new to me  He reports for the last few weeks, he's been feeling worsening anxiety and depression. Last week, he had a 'break down'. He says he's had waves his whole life in his hair. He went to a new barber who 'messed up his waves. He got in the car and started crying. He says this mood has been present for some years. While in college, he reports going to counseling while in college at [employer] [facility] [employer] [facility]. He did this for a year and this helped but once he left college he stopped. He feels like at this time in his life, she wants to try medication. He says his mother suffers from bipolar disorder.  Flowsheet Row Office Visit from 07/11/2023 in [facility]  PHQ-9 Total Score 24          07/11/2023   10:31 AM  GAD 7 : Generalized Anxiety Score  Nervous, Anxious, on Edge 3  Control/stop worrying 3  Worry too much - different things 3  Trouble relaxing 3  Restless 3  Easily annoyed or irritable 3  Afraid - awful might happen 3  Total GAD 7 Score 21  Anxiety Difficulty Very difficult     Diabetes He has endocrinologist that he sees with [facility]. He is on metformin, Toujeo, Jardiance, Ozempic. He plans to follow up with them soon. He needs refills on these medicines. Last follow up with Endo was Nov 2024.   Outpatient Encounter Medications as of 07/11/2023  Medication Sig   insulin  glargine, 2 Unit Dial, (TOUJEO MAX SOLOSTAR) 300 UNIT/ML Solostar Pen INJECT 40 UNITS INTO THE SKIN DAILY, MAX DAILY DOSE UP TO 60 UNITS   Continuous  Glucose Sensor (DEXCOM G7 SENSOR) MISC 1 each by Does not apply route. (Patient not taking: Reported on 07/11/2023)   empagliflozin (JARDIANCE) 25 MG TABS tablet Take 25 mg by mouth daily. (Patient not taking: Reported on 07/11/2023)   fenofibrate (TRICOR) 145 MG tablet Take 1 tablet by mouth daily. (Patient not taking: Reported on 07/11/2023)   metFORMIN (GLUCOPHAGE-XR) 500 MG 24 hr tablet Take 1,000 mg by mouth. (Patient not taking: Reported on 07/11/2023)   OZEMPIC, 1 MG/DOSE, 4 MG/3ML SOPN Inject 1 mg into the skin once a week. (Patient not taking: Reported on 07/11/2023)   [DISCONTINUED] hydrOXYzine  (ATARAX /VISTARIL ) 25 MG tablet Take 1 tablet (25 mg total) by mouth every 6 (six) hours. (Patient not taking: Reported on 04/09/2017)   No facility-administered encounter medications on file as of 07/11/2023.    Past Medical History:  Diagnosis Date   Asthma    Diabetes mellitus without complication (HCC)     Past Surgical History:  Procedure Laterality Date   WISDOM TOOTH EXTRACTION      Family History  Problem Relation Age of Onset   Bipolar disorder Mother    CAD Mother    CAD Father    Schizophrenia Maternal Grandmother    CAD Other     Social History  Socioeconomic History   Marital status: Single    Spouse name: Not on file   Number of children: 0   Years of education: Not on file   Highest education level: Not on file  Occupational History   Not on file  Tobacco Use   Smoking status: Never    Passive exposure: Never   Smokeless tobacco: Never  Vaping Use   Vaping status: Some Days  Substance and Sexual Activity   Alcohol use: Yes    Comment: occ   Drug use: No   Sexual activity: Not Currently  Other Topics Concern   Not on file  Social History Narrative   Not on file   Social Drivers of Health   Financial Resource Strain: Low Risk  (03/28/2022)   Received from [facility]   Overall Financial Resource Strain (CARDIA)    Difficulty of Paying Living  Expenses: Not hard at all  Food Insecurity: No Food Insecurity (03/28/2022)   Received from [facility]   Hunger Vital Sign    Worried About Running Out of Food in the Last Year: Never true    Ran Out of Food in the Last Year: Never true  Transportation Needs: No Transportation Needs (03/28/2022)   Received from [facility] - Transportation    Lack of Transportation (Medical): No    Lack of Transportation (Non-Medical): No  Physical Activity: Sufficiently Active (03/28/2022)   Received from [facility]   Exercise Vital Sign    Days of Exercise per Week: 5 days    Minutes of Exercise per Session: 30 min  Stress: Stress Concern Present (03/28/2022)   Received from [facility] - Occupational Stress Questionnaire    Feeling of Stress : To some extent  Social Connections: Socially Integrated (03/28/2022)   [facility]   Social Network    How would you rate your social network (family, work, friends)?: Good participation with social networks  Intimate Partner Violence: Not At Risk (03/28/2022)   Received from [facility]   HITS    Over the last 12 months how often did your partner physically hurt you?: Never    Over the last 12 months how often did your partner insult you or talk down to you?: Never    Over the last 12 months how often did your partner threaten you with physical harm?: Never    Over the last 12 months how often did your partner scream or curse at you?: Never    Review of Systems  Psychiatric/Behavioral:  Positive for depression. The patient is nervous/anxious and has insomnia.   All other systems reviewed and are negative.      Objective    BP 129/79   Pulse 96   Temp 98.2 F (36.8 C) (Oral)   Resp 18   Ht 5\' 11"  (1.803 m)   Wt 247 lb 1.6 oz (112.1 kg)   SpO2 99%   BMI 34.46 kg/m   Physical Exam Vitals and nursing note reviewed.  Constitutional:      Appearance: Normal appearance. He  is obese.  HENT:     Head: Normocephalic and atraumatic.     Right Ear: External ear normal.     Left Ear: External ear normal.     Nose: Nose normal.     Mouth/Throat:     Mouth: Mucous membranes are moist.     Pharynx: Oropharynx is clear.  Eyes:  Conjunctiva/sclera: Conjunctivae normal.     Pupils: Pupils are equal, round, and reactive to light.  Cardiovascular:     Rate and Rhythm: Normal rate and regular rhythm.     Pulses: Normal pulses.     Heart sounds: Normal heart sounds.  Pulmonary:     Effort: Pulmonary effort is normal.     Breath sounds: Normal breath sounds.  Skin:    General: Skin is warm.     Capillary Refill: Capillary refill takes less than 2 seconds.  Neurological:     General: No focal deficit present.     Mental Status: He is alert and oriented to person, place, and time. Mental status is at baseline.  Psychiatric:        Mood and Affect: Mood normal.        Behavior: Behavior normal.        Thought Content: Thought content normal.        Judgment: Judgment normal.     Assessment & Plan:   Problem List Items Addressed This Visit   None Encounter to establish care with new doctor  Severe episode of recurrent major depressive disorder, without psychotic features (HCC) -     lamoTRIgine; Take 1 tab po at night x 7 days then take 1 tab po BID  Dispense: 60 tablet; Refill: 0 -     Ambulatory referral to Behavioral Health  Uncontrolled diabetes mellitus with hyperglycemia, with long-term current use of insulin  (HCC) -     Hemoglobin A1c -     Comprehensive metabolic panel with GFR -     Lipid panel    Pt with mood disorder/severe depression for the last several years, worsening. Pt has been to ER for this since 2019 a few times but never followed up care. Will refer to [facility] for formal diagnosis but with hx of bipolar depression, suspect this in this pt. Will start mood stabilizer Lamictal 25mg  nightly x 7 days then BID. To follow up in 2 weeks.  For  diabetes, lost to follow up by Endo. Last visit Nov 2024. Advised I'd do labs first before filling his medication. Advised to follow up with Endo soon.  No follow-ups on file.   Manette Section, MD

## 2023-07-12 ENCOUNTER — Ambulatory Visit: Payer: Self-pay | Admitting: Family Medicine

## 2023-07-12 ENCOUNTER — Other Ambulatory Visit: Payer: Self-pay | Admitting: Family Medicine

## 2023-07-12 DIAGNOSIS — E781 Pure hyperglyceridemia: Secondary | ICD-10-CM

## 2023-07-12 DIAGNOSIS — F332 Major depressive disorder, recurrent severe without psychotic features: Secondary | ICD-10-CM

## 2023-07-12 DIAGNOSIS — E1165 Type 2 diabetes mellitus with hyperglycemia: Secondary | ICD-10-CM

## 2023-07-12 DIAGNOSIS — E782 Mixed hyperlipidemia: Secondary | ICD-10-CM

## 2023-07-12 LAB — COMPREHENSIVE METABOLIC PANEL WITH GFR
ALT: 75 IU/L — ABNORMAL HIGH (ref 0–44)
AST: 31 IU/L (ref 0–40)
Albumin: 4.5 g/dL (ref 4.1–5.1)
Alkaline Phosphatase: 140 IU/L — ABNORMAL HIGH (ref 44–121)
BUN/Creatinine Ratio: 12 (ref 9–20)
BUN: 11 mg/dL (ref 6–20)
Bilirubin Total: 0.3 mg/dL (ref 0.0–1.2)
CO2: 20 mmol/L (ref 20–29)
Calcium: 9.8 mg/dL (ref 8.7–10.2)
Chloride: 95 mmol/L — ABNORMAL LOW (ref 96–106)
Creatinine, Ser: 0.9 mg/dL (ref 0.76–1.27)
Globulin, Total: 3 g/dL (ref 1.5–4.5)
Glucose: 407 mg/dL — ABNORMAL HIGH (ref 70–99)
Potassium: 4.6 mmol/L (ref 3.5–5.2)
Sodium: 133 mmol/L — ABNORMAL LOW (ref 134–144)
Total Protein: 7.5 g/dL (ref 6.0–8.5)
eGFR: 117 mL/min/{1.73_m2} (ref >59)

## 2023-07-12 LAB — LIPID PANEL
Chol/HDL Ratio: 10.9 ratio — ABNORMAL HIGH (ref 0.0–5.0)
Cholesterol, Total: 413 mg/dL — ABNORMAL HIGH (ref 100–199)
HDL: 38 mg/dL — ABNORMAL LOW (ref >39)
Triglycerides: 1058 mg/dL (ref 0–149)

## 2023-07-12 LAB — HEMOGLOBIN A1C
Est. average glucose Bld gHb Est-mCnc: 381 mg/dL
Hgb A1c MFr Bld: 14.9 % — ABNORMAL HIGH (ref 4.8–5.6)

## 2023-07-12 MED ORDER — METFORMIN HCL ER 500 MG PO TB24: 1000.0000 mg | ORAL_TABLET | Freq: Two times a day (BID) | ORAL | 1 refills | Status: DC

## 2023-07-12 MED ORDER — ATORVASTATIN CALCIUM 20 MG PO TABS: 20.0000 mg | ORAL_TABLET | Freq: Every evening | ORAL | 1 refills | Status: DC

## 2023-07-12 MED ORDER — EMPAGLIFLOZIN 25 MG PO TABS: 25.0000 mg | ORAL_TABLET | Freq: Every day | ORAL | 1 refills | Status: AC

## 2023-07-12 MED ORDER — INSULIN DEGLUDEC 200 UNIT/ML ~~LOC~~ SOPN: 40.0000 [IU] | PEN_INJECTOR | Freq: Every day | SUBCUTANEOUS | 1 refills | Status: DC

## 2023-07-12 MED ORDER — TOUJEO MAX SOLOSTAR 300 UNIT/ML ~~LOC~~ SOPN: 40.0000 [IU] | PEN_INJECTOR | Freq: Every day | SUBCUTANEOUS | 1 refills | Status: DC

## 2023-07-12 MED ORDER — FENOFIBRATE 145 MG PO TABS: 145.0000 mg | ORAL_TABLET | Freq: Every day | ORAL | 1 refills | Status: DC

## 2023-07-17 NOTE — Telephone Encounter (Signed)
LVM for patient to return call 2nd attempt

## 2023-07-21 IMAGING — CR DG CHEST 2V
2 series · 2 of 2 positions shown · non-contrast
Comparison: No priors.

CLINICAL DATA: 30-year-old male with history of chest discomfort.

EXAM:
CHEST - 2 VIEW

[w chest pa]
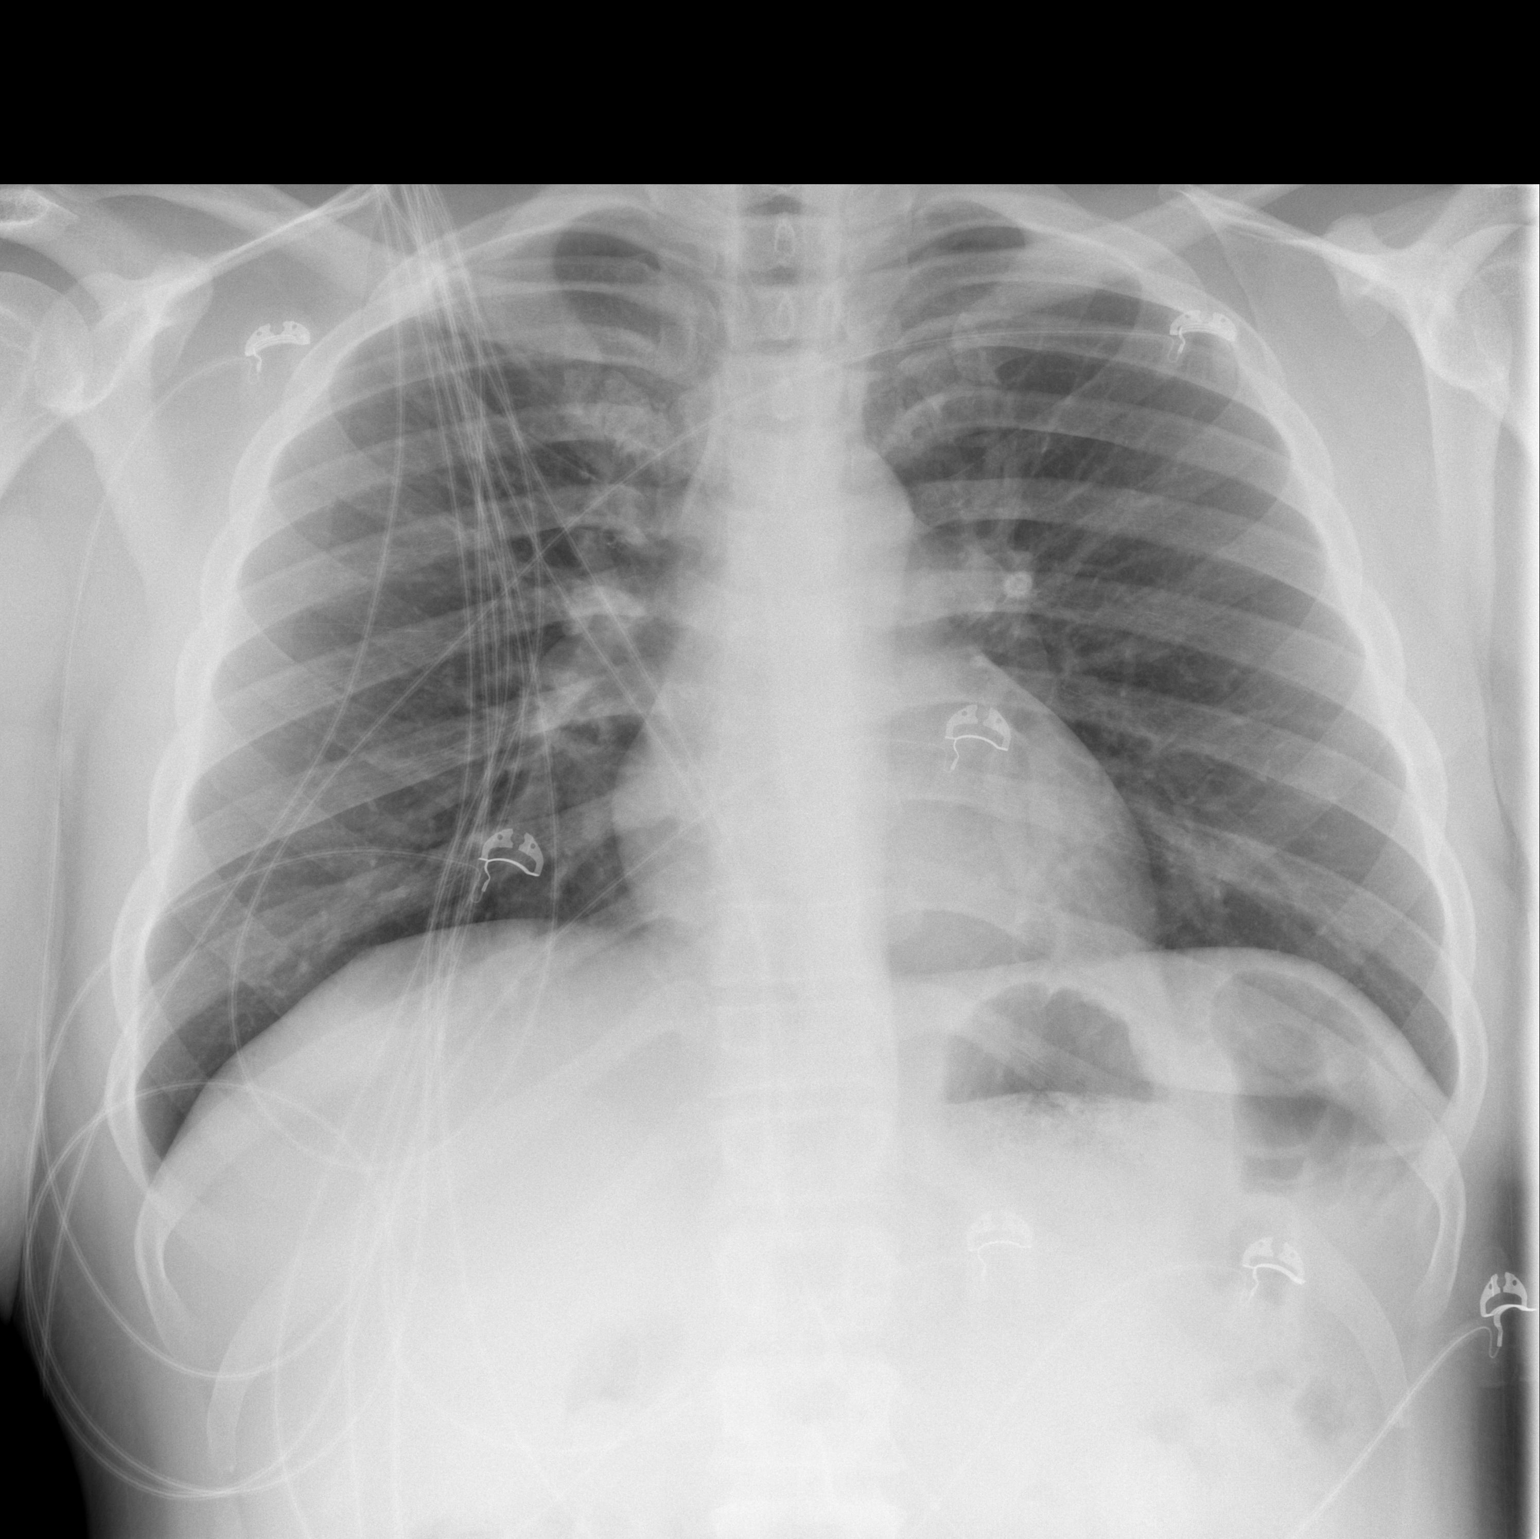

[w chest lat]
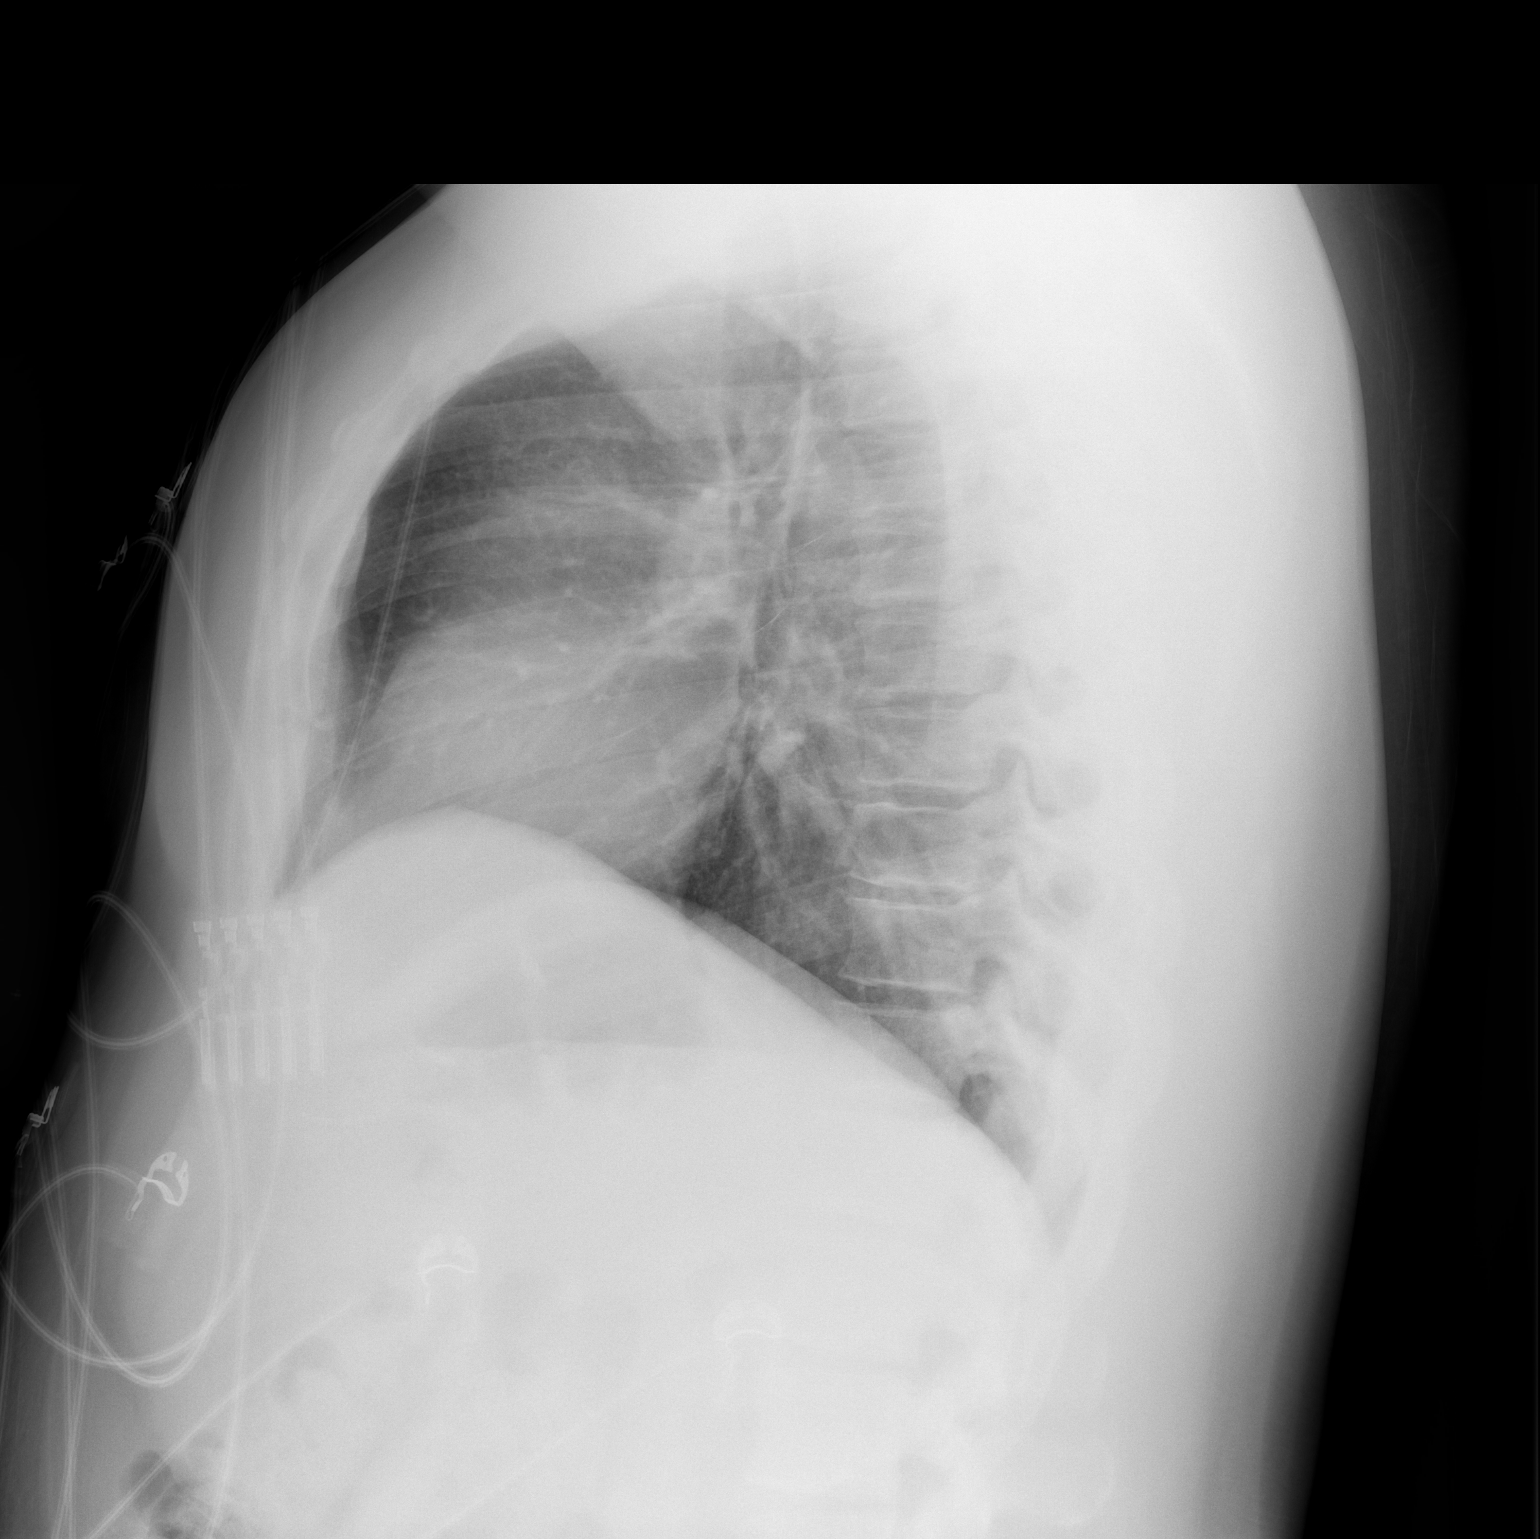

[2 of 2 positions shown; findings below may reference images not displayed]

FINDINGS: Lung volumes are normal. No consolidative airspace disease. No
pleural effusions. No pneumothorax. No pulmonary nodule or mass
noted. Pulmonary vasculature and the cardiomediastinal silhouette
are within normal limits.
IMPRESSION: No radiographic evidence of acute cardiopulmonary disease.

## 2023-07-25 ENCOUNTER — Ambulatory Visit: Admitting: Family Medicine

## 2023-07-25 ENCOUNTER — Encounter: Payer: Self-pay | Admitting: Family Medicine

## 2023-07-25 VITALS — BP 125/82 | HR 68 | Temp 97.9°F | Resp 18 | Ht 71.0 in | Wt 247.7 lb

## 2023-07-25 DIAGNOSIS — F332 Major depressive disorder, recurrent severe without psychotic features: Secondary | ICD-10-CM

## 2023-07-25 DIAGNOSIS — Z794 Long term (current) use of insulin: Secondary | ICD-10-CM

## 2023-07-25 DIAGNOSIS — E1165 Type 2 diabetes mellitus with hyperglycemia: Secondary | ICD-10-CM | POA: Diagnosis not present

## 2023-07-25 DIAGNOSIS — N5314 Retrograde ejaculation: Secondary | ICD-10-CM

## 2023-07-25 MED ORDER — SEMAGLUTIDE(0.25 OR 0.5MG/DOS) 2 MG/3ML ~~LOC~~ SOPN
0.2500 mg | PEN_INJECTOR | SUBCUTANEOUS | 1 refills | Status: DC
Start: 2023-07-25 — End: 2023-12-23

## 2023-07-25 MED ORDER — LAMOTRIGINE 25 MG PO TABS: 25.0000 mg | ORAL_TABLET | Freq: Two times a day (BID) | ORAL | 5 refills | Status: DC

## 2023-07-25 MED ORDER — INSULIN GLARGINE 100 UNIT/ML ~~LOC~~ SOLN: 40.0000 [IU] | Freq: Every day | SUBCUTANEOUS | 5 refills | Status: DC

## 2023-07-25 MED ORDER — DEXCOM G7 RECEIVER DEVI: 5 refills | Status: AC

## 2023-07-25 MED ORDER — DEXCOM G7 SENSOR MISC: 5 refills | Status: DC

## 2023-07-25 NOTE — Progress Notes (Signed)
 Established Patient Office Visit  Subjective   Patient ID: Ricky [name], male    DOB: 1990/11/18  Age: 33 y.o. MRN: [id number]  Chief Complaint  Patient presents with   Follow-up    Patient is here for a two week follow up for Mood    HPI  Mood disorder Pt with hx of mood disorder. Started on Lamictal  25 mg BID. Referred to [facility]. He is yet to get appt with [facility]. He reports he was on vacation last week so unsure if this was the reason for his improved mood.  Flowsheet Row Office Visit from 07/25/2023 in [facility] at [facility]  PHQ-9 Total Score 11          07/25/2023    9:43 AM 07/11/2023   10:31 AM  GAD 7 : Generalized Anxiety Score  Nervous, Anxious, on Edge 1 3  Control/stop worrying 1 3  Worry too much - different things 1 3  Trouble relaxing 1 3  Restless 1 3  Easily annoyed or irritable 1 3  Afraid - awful might happen 1 3  Total GAD 7 Score 7 21  Anxiety Difficulty Somewhat difficult Very difficult      Diabetes Pt reports he was on vacation and didn't get his diabetic medicines yet. He asked to be sent in dexcom monitoring systems for checking his sugars.  Pt was also on Ozempic but haven't been on this in the last 2 months. Would like to restart this to help with sugars and weight. He tolerated this medicine well when he was taking it.   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 He was referred to fertility clinic that he didn't go to yet. He is needing new referral.    Review of Systems  All other systems reviewed and are negative.    Objective:     BP 125/82   Pulse 68   Temp 97.9 F (36.6 C) (Oral)   Resp 18   Ht 5\' 11"  (1.803 m)   Wt 247 lb 11.2 oz (112.4 kg)   SpO2 99%   BMI 34.55 kg/m  BP Readings from Last 3 Encounters:  07/25/23 125/82  07/11/23 129/79  05/30/21 118/74      Physical Exam Vitals and nursing note reviewed.  Constitutional:      Appearance: Normal  appearance. He is obese.  HENT:     Head: Normocephalic and atraumatic.     Right Ear: External ear normal.     Left Ear: External ear normal.     Nose: Nose normal.     Mouth/Throat:     Mouth: Mucous membranes are moist.     Pharynx: Oropharynx is clear.  Eyes:     Conjunctiva/sclera: Conjunctivae normal.     Pupils: Pupils are equal, round, and reactive to light.  Cardiovascular:     Rate and Rhythm: Normal rate.  Pulmonary:     Effort: Pulmonary effort is normal.  Skin:    General: Skin is warm.     Capillary Refill: Capillary refill takes less than 2 seconds.  Neurological:     General: No focal deficit present.     Mental Status: He is alert and oriented to person, place, and time. Mental status is at baseline.  Psychiatric:        Mood and Affect: Mood normal.        Behavior: Behavior normal.        Thought Content: Thought  content normal.        Judgment: Judgment normal.    No results found for any visits on 07/25/23.  Last hemoglobin A1c Lab Results  Component Value Date   HGBA1C 14.9 (H) 07/11/2023      The ASCVD Risk score (Arnett DK, et al., 2019) failed to calculate for the following reasons:   The 2019 ASCVD risk score is only valid for ages 4 to 71    Assessment & Plan:   Problem List Items Addressed This Visit       Endocrine   Uncontrolled diabetes mellitus with hyperglycemia, with long-term current use of insulin  (HCC)   Relevant Medications   Semaglutide,0.25 or 0.5MG /DOS, 2 MG/3ML SOPN   insulin  glargine (LANTUS) 100 UNIT/ML injection   Continuous Glucose Receiver (DEXCOM G7 RECEIVER) DEVI   Continuous Glucose Sensor (DEXCOM G7 SENSOR) MISC   Other Visit Diagnoses       Severe episode of recurrent major depressive disorder, without psychotic features (HCC)    -  Primary   Relevant Medications   lamoTRIgine  (LAMICTAL ) 25 MG tablet   Other Relevant Orders   Ambulatory referral to Behavioral Health     Severe episode of recurrent  major depressive disorder, without psychotic features (HCC) -     Ambulatory referral to Behavioral Health -     lamoTRIgine ; Take 1 tablet (25 mg total) by mouth 2 (two) times daily.  Dispense: 60 tablet; Refill: 5  Uncontrolled diabetes mellitus with hyperglycemia, with long-term current use of insulin  (HCC) -     Semaglutide(0.25 or 0.5MG /DOS); Inject 0.25 mg into the skin once a week.  Dispense: 3 mL; Refill: 1 -     Insulin  Glargine; Inject 0.4 mLs (40 Units total) into the skin daily.  Dispense: 12 mL; Refill: 5 -     Dexcom G7 Receiver; Use for checking sugars  Dispense: 1 each; Refill: 5 -     Dexcom G7 Sensor; Check sugars daily  Dispense: 1 each; Refill: 5  Retrograde ejaculation -     Ambulatory referral to Endocrinology   Pt to continue Lamictal  25mg  BID for now. Refills given. Placed repeat referral to [facility]  Pt with uncontrolled DM and obesity. Would like to restart Ozempic. To restart 0.25mg  weekly. Also sent in Dexcom system for monitoring sugars. Recheck in 2 months.  Refer to Fertility clinic for retrograde ejaculation.    Return in about 3 months (around 10/12/2023) for Diabetes.    Manette Section, MD

## 2023-08-16 ENCOUNTER — Other Ambulatory Visit: Payer: Self-pay | Admitting: Family Medicine

## 2023-08-16 DIAGNOSIS — E782 Mixed hyperlipidemia: Secondary | ICD-10-CM

## 2023-08-16 DIAGNOSIS — E1165 Type 2 diabetes mellitus with hyperglycemia: Secondary | ICD-10-CM

## 2023-09-24 ENCOUNTER — Telehealth: Payer: Self-pay

## 2023-09-24 NOTE — Telephone Encounter (Signed)
 Prior auth for: DEXCOM RECEIVER  Determination: PENDING Auth #: [id number] Valid from: N/A Patient notified via MyChart

## 2023-09-24 NOTE — Telephone Encounter (Signed)
 Shayne from [facility] called reporting that they are missing clinical information. They are missing documentation for positive clinical response for Dexcom  Best contact: [phone]

## 2023-09-26 ENCOUNTER — Ambulatory Visit (HOSPITAL_COMMUNITY): Payer: Self-pay | Admitting: Family

## 2023-10-04 NOTE — Telephone Encounter (Signed)
 Noted! Thank you

## 2023-10-10 ENCOUNTER — Ambulatory Visit (HOSPITAL_BASED_OUTPATIENT_CLINIC_OR_DEPARTMENT_OTHER): Payer: Self-pay | Admitting: Family

## 2023-10-10 DIAGNOSIS — F0630 Mood disorder due to known physiological condition, unspecified: Secondary | ICD-10-CM

## 2023-10-10 DIAGNOSIS — G479 Sleep disorder, unspecified: Secondary | ICD-10-CM

## 2023-10-10 DIAGNOSIS — F332 Major depressive disorder, recurrent severe without psychotic features: Secondary | ICD-10-CM

## 2023-10-10 MED ORDER — HYDROXYZINE HCL 10 MG PO TABS: 10.0000 mg | ORAL_TABLET | Freq: Every evening | ORAL | 0 refills | Status: AC | PRN

## 2023-10-10 MED ORDER — LAMOTRIGINE 25 MG PO TABS: ORAL_TABLET | ORAL | 1 refills | Status: DC

## 2023-10-10 NOTE — Progress Notes (Unsigned)
 Virtual Visit via Video Note  I connected with Ricky Ricky [name] on 10/10/23 at  4:00 PM EDT by a video enabled telemedicine application and verified that I am speaking with the correct person using two identifiers.  Location: Patient: Home Provider: Office   I discussed the limitations of evaluation and management by telemedicine and the availability of in person appointments. The patient expressed understanding and agreed to proceed.    I discussed the assessment and treatment plan with the patient. The patient was provided an opportunity to ask questions and all were answered. The patient agreed with the plan and demonstrated an understanding of the instructions.   The patient was advised to call back or seek an in-person evaluation if the symptoms worsen or if the condition fails to improve as anticipated.  I provided 20 minutes of non-face-to-face time during this encounter.   Staci LOISE Kerns, NP    Psychiatric Initial Adult Assessment   Patient Identification: Ricky Ricky [name] MRN:  [id number] Date of Evaluation:  10/11/2023 Referral Source: Colette, MD Chief Complaint:   My mood is all over the place.  Visit Diagnosis:    ICD-10-CM   1. Mood disorder in conditions classified elsewhere  F06.30     2. Sleep disturbance  G47.9     3. Severe episode of recurrent major depressive disorder, without psychotic features (HCC)  F33.2 lamoTRIgine  (LAMICTAL ) 25 MG tablet      History of Present Illness:  Ricky Ricky [name] 33 year old African-American male who presents to establish care.  Reports Ricky [name] was referred by his primary care provider Dr.Rucker.  Reports Ricky [name] has been explaining to his primary care provider hide Ricky [name] is experiencing mood lability, depression and poor sleeping hygiene.  States Ricky [name] has been struggling with symptoms on and off for most of his life.  Denied that Ricky [name] has ever been compliant with medications.  States Ricky [name] was initiated on Lamictal  at previous primary  care visit however did not feel that the medication was helping with his symptoms so Ricky [name] has since discontinued medications.  Ricky [name] denied previous inpatient admissions.  Suicidal attempts in the past of suicidal ideations.  Denied illicit drug use or substance abuse history.    Ricky Ricky [name] reports episodes which Ricky [name] describes as :manic events.  Some days not happy then sad, stated that sometimes I start crying out of nowhere.  Reports Ricky [name] was previously prescribed psychotropic medication in his younger years but is unable to recall the names of the medications.    I got close to checking myself into a hospital, but I came out of my manic episode at that time.  Denies auditory visual hallucinations.  Denied paranoia,delusions or psychosis.  Ricky Ricky [name] reports Ricky [name] currently resides with his fiance states his significant other is supportive.  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  Reports Ricky [name] last was followed by therapy services when Ricky [name] attended [employer] for his undergraduate degree in business/finances .  Reports Ricky [name] was last followed by therapy 10 years prior.  Ricky Ricky [name] reports a history related to mental illness states his mother was diagnosed with bipolar disorder, schizophrenia throughout both sides of his family. Ricky [name] reports occasional marijuana and alcohol use.  Poor appetite and  fluctuating sleep disturbance due to shift work. PHQ9 16  Mood disorder: Sleep disturbance:  Patient encouraged to restart Lamictal  25 mg x 7 days to Lamictal  50 mg daily titration schedule.  Education provided with rash cough aches fevers and/or chills with medication.  Discussed therapeutic dose between  100 to 200 mg daily.  Initiated hydroxyzine  for sleep disturbance as needed Ricky [name] was amendable to plan.  Ricky Ricky [name] is sitting on his couch; Ricky [name] is alert/oriented x 4; calm/cooperative; and mood congruent with affect.  Patient is speaking in a clear tone at moderate volume, and normal pace; with good  eye contact. His thought process is coherent and relevant; There is no indication that Ricky [name] is currently responding to internal/external stimuli or experiencing delusional thought content.  Patient denies suicidal/self-harm/homicidal ideation, psychosis, and paranoia.  Patient has remained calm throughout assessment and has answered questions appropriately.     Associated Signs/Symptoms: Depression Symptoms:  depressed mood, difficulty concentrating, anxiety, (Hypo) Manic Symptoms:  Distractibility, Irritable Mood, Anxiety Symptoms:  Excessive Worry, Psychotic Symptoms:  Hallucinations: None PTSD Symptoms: NA  Past Psychiatric History:   Previous Psychotropic Medications: Yes   Substance Abuse History in the last 12 months:  No.  Consequences of Substance Abuse: NA  Past Medical History:  Past Medical History:  Diagnosis Date   Asthma    Diabetes mellitus without complication (HCC)     Past Surgical History:  Procedure Laterality Date   WISDOM TOOTH EXTRACTION      Family Psychiatric History:   Family History:  Family History  Problem Relation Age of Onset   Bipolar disorder Mother    CAD Mother    CAD Father    Schizophrenia Maternal Grandmother    CAD Other     Social History:   Social History   Socioeconomic History   Marital status: Single    Spouse name: Not on file   Number of children: 0   Years of education: Not on file   Highest education level: Not on file  Occupational History   Not on file  Tobacco Use   Smoking status: Never    Passive exposure: Never   Smokeless tobacco: Never  Vaping Use   Vaping status: Some Days  Substance and Sexual Activity   Alcohol use: Yes    Comment: occ   Drug use: No   Sexual activity: Not Currently  Other Topics Concern   Not on file  Social History Narrative   Not on file   Social Drivers of Health   Financial Resource Strain: Low Risk  (03/28/2022)   Received from [facility]   Overall Financial  Resource Strain (CARDIA)    Difficulty of Paying Living Expenses: Not hard at all  Food Insecurity: No Food Insecurity (03/28/2022)   Received from [facility]   Hunger Vital Sign    Within the past 12 months, you worried that your food would run out before you got the money to buy more.: Never true    Within the past 12 months, the food you bought just didn't last and you didn't have money to get more.: Never true  Transportation Needs: No Transportation Needs (03/28/2022)   Received from [facility] - Transportation    Lack of Transportation (Medical): No    Lack of Transportation (Non-Medical): No  Physical Activity: Sufficiently Active (03/28/2022)   Received from [facility]   Exercise Vital Sign    On average, how many days per week do you engage in moderate to strenuous exercise (like a brisk walk)?: 5 days    On average, how many minutes do you engage in exercise at this level?: 30 min  Stress: Stress Concern Present (03/28/2022)   Received from [facility] - Occupational  Stress Questionnaire    Feeling of Stress : To some extent  Social Connections: Socially Integrated (03/28/2022)   Received from [facility]   Social Network    How would you rate your social network (family, work, friends)?: Good participation with social networks    Additional Social History:   Allergies:  No Known Allergies  Metabolic Disorder Labs: Lab Results  Component Value Date   HGBA1C 14.9 (H) 07/11/2023   No results found for: PROLACTIN Lab Results  Component Value Date   CHOL 413 (H) 07/11/2023   TRIG 1,058 (HH) 07/11/2023   HDL 38 (L) 07/11/2023   CHOLHDL 10.9 (H) 07/11/2023   LDLCALC Comment (A) 07/11/2023   No results found for: TSH  Therapeutic Level Labs: No results found for: LITHIUM No results found for: CBMZ No results found for: VALPROATE  Current Medications: Current Outpatient Medications  Medication  Sig Dispense Refill   hydrOXYzine  (ATARAX ) 10 MG tablet Take 1 tablet (10 mg total) by mouth at bedtime as needed. 30 tablet 0   atorvastatin  (LIPITOR) 20 MG tablet TAKE 1 TABLET BY MOUTH EVERYDAY AT BEDTIME 90 tablet 1   Continuous Glucose Receiver (DEXCOM G7 RECEIVER) DEVI Use for checking sugars 1 each 5   Continuous Glucose Sensor (DEXCOM G7 SENSOR) MISC Check sugars daily 1 each 5   empagliflozin  (JARDIANCE ) 25 MG TABS tablet Take 1 tablet (25 mg total) by mouth daily. 30 tablet 1   fenofibrate  (TRICOR ) 145 MG tablet Take 1 tablet (145 mg total) by mouth daily. 30 tablet 1   insulin  glargine (LANTUS ) 100 UNIT/ML injection Inject 0.4 mLs (40 Units total) into the skin daily. 12 mL 5   lamoTRIgine  (LAMICTAL ) 25 MG tablet Take 1 tablet (25 mg total) by mouth daily for 7 days, THEN 2 tablets (50 mg total) daily. 60 tablet 1   metFORMIN  (GLUCOPHAGE -XR) 500 MG 24 hr tablet TAKE 2 TABLETS (1,000 MG TOTAL) BY MOUTH 2 (TWO) TIMES DAILY WITH A MEAL. 360 tablet 1   No current facility-administered medications for this visit.    Musculoskeletal: Strength & Muscle Tone: within normal limits Gait & Station: normal Patient leans: N/A  Psychiatric Specialty Exam: Review of Systems  There were no vitals taken for this visit.There is no height or weight on file to calculate BMI.  General Appearance: Casual  Eye Contact:  Good  Speech:  Clear and Coherent  Volume:  Normal  Mood:  Anxious and Depressed  Affect:  Congruent  Thought Process:  Coherent  Orientation:  Full (Time, Place, and Person)  Thought Content:  Logical  Suicidal Thoughts:  No  Homicidal Thoughts:  No  Memory:  Immediate;   Good Recent;   Good  Judgement:  Good  Insight:  Good  Psychomotor Activity:  Normal  Concentration:  Concentration: Good  Recall:  Good  Fund of Knowledge:Good  Language: Good  Akathisia:  No  Handed:  Right  AIMS (if indicated):  not done  Assets:  Communication Skills Desire for  Improvement Resilience Social Support  ADL's:  Intact  Cognition: WNL  Sleep:  Fair 4/5 hours nightly    Screenings: GAD-7    Flowsheet Row Office Visit from 07/25/2023 in [facility] at [facility] Visit from 07/11/2023 in [facility] at [facility]  Total GAD-7 Score 7 21   PHQ2-9    Flowsheet Row Office Visit from 10/10/2023 in [facility] Office Visit from 07/25/2023 in [facility] Primary  Care at [facility] Visit from 07/11/2023 in [facility] at [facility] Total Score 5 2 6   PHQ-9 Total Score 18 11 24    Flowsheet Row Office Visit from 10/10/2023 in [facility] from 05/30/2021 in [facility] Emergency Department at [facility]  C-SSRS RISK CATEGORY Error: Q3, 4, or 5 should not be populated when Q2 is No No Risk    Assessment and Plan: Ricky Ricky [name] is a 33 year old African-American male who presents to establish care.  Denied that Ricky [name] is currently followed by therapy or psychiatry services.  Ricky [name] has been experiencing multiple moods throughout the day.  Ricky [name] reports a family history related to bipolar disorder.  States Ricky [name] was last seen and evaluated by therapist 10 years prior.  Denied it is currently followed by psychiatry services.  Reported primary care initiated lithium however has since discontinued medication due to no effects/changes with his mood.  Ricky [name] reports Ricky [name] was able to tolerate medications well.  Denied rash, fevers, headaches or chills with medications.  Reports current occasional marijuana and alcohol use.  Will restart Lamictal  25 mg -50 mg on a taper schedule.  To include hydroxyzine  10 mg for sleep disturbance  Labs reviewed lipid panel elevated triglycerides, cholesterol 413 HDL 38, A1c 14  Collaboration of Care: Medication Management AEB restart Lamictal  25 to 50 mg daily  Patient/Guardian was advised  Release of Information must be obtained prior to any record release in order to collaborate their care with an outside provider. Patient/Guardian was advised if they have not already done so to contact the registration department to sign all necessary forms in order for us  to release information regarding their care.   Consent: Patient/Guardian gives verbal consent for treatment and assignment of benefits for services provided during this visit. Patient/Guardian expressed understanding and agreed to proceed.   Staci LOISE Kerns, NP 8/21/202511:17 AM

## 2023-10-31 ENCOUNTER — Telehealth (HOSPITAL_COMMUNITY): Admitting: Family

## 2023-10-31 ENCOUNTER — Encounter (HOSPITAL_COMMUNITY): Payer: Self-pay

## 2023-10-31 ENCOUNTER — Telehealth (HOSPITAL_COMMUNITY): Payer: Self-pay | Admitting: Family

## 2023-11-01 ENCOUNTER — Other Ambulatory Visit (HOSPITAL_COMMUNITY): Payer: Self-pay | Admitting: Family

## 2023-11-03 ENCOUNTER — Other Ambulatory Visit: Payer: Self-pay

## 2023-11-03 ENCOUNTER — Emergency Department (HOSPITAL_BASED_OUTPATIENT_CLINIC_OR_DEPARTMENT_OTHER)

## 2023-11-03 ENCOUNTER — Inpatient Hospital Stay (HOSPITAL_BASED_OUTPATIENT_CLINIC_OR_DEPARTMENT_OTHER)
Admission: EM | Admit: 2023-11-03 | Discharge: 2023-11-05 | DRG: 287 | Disposition: A | Discharge status: Home / Self Care | Attending: Internal Medicine | Admitting: Internal Medicine

## 2023-11-03 ENCOUNTER — Encounter (HOSPITAL_BASED_OUTPATIENT_CLINIC_OR_DEPARTMENT_OTHER): Payer: Self-pay | Admitting: Emergency Medicine

## 2023-11-03 DIAGNOSIS — Z79899 Other long term (current) drug therapy: Secondary | ICD-10-CM | POA: Diagnosis not present

## 2023-11-03 DIAGNOSIS — M545 Low back pain, unspecified: Secondary | ICD-10-CM | POA: Diagnosis present

## 2023-11-03 DIAGNOSIS — I200 Unstable angina: Secondary | ICD-10-CM

## 2023-11-03 DIAGNOSIS — Z6833 Body mass index (BMI) 33.0-33.9, adult: Secondary | ICD-10-CM

## 2023-11-03 DIAGNOSIS — J45909 Unspecified asthma, uncomplicated: Secondary | ICD-10-CM | POA: Diagnosis present

## 2023-11-03 DIAGNOSIS — I10 Essential (primary) hypertension: Secondary | ICD-10-CM

## 2023-11-03 DIAGNOSIS — Z91199 Patient's noncompliance with other medical treatment and regimen due to unspecified reason: Secondary | ICD-10-CM | POA: Diagnosis not present

## 2023-11-03 DIAGNOSIS — Z794 Long term (current) use of insulin: Secondary | ICD-10-CM | POA: Diagnosis not present

## 2023-11-03 DIAGNOSIS — R079 Chest pain, unspecified: Principal | ICD-10-CM

## 2023-11-03 DIAGNOSIS — R7989 Other specified abnormal findings of blood chemistry: Secondary | ICD-10-CM | POA: Diagnosis present

## 2023-11-03 DIAGNOSIS — E66811 Obesity, class 1: Secondary | ICD-10-CM | POA: Diagnosis present

## 2023-11-03 DIAGNOSIS — F17290 Nicotine dependence, other tobacco product, uncomplicated: Secondary | ICD-10-CM | POA: Diagnosis present

## 2023-11-03 DIAGNOSIS — Z8249 Family history of ischemic heart disease and other diseases of the circulatory system: Secondary | ICD-10-CM | POA: Diagnosis not present

## 2023-11-03 DIAGNOSIS — Z7982 Long term (current) use of aspirin: Secondary | ICD-10-CM | POA: Diagnosis not present

## 2023-11-03 DIAGNOSIS — R0602 Shortness of breath: Secondary | ICD-10-CM | POA: Diagnosis not present

## 2023-11-03 DIAGNOSIS — Z7984 Long term (current) use of oral hypoglycemic drugs: Secondary | ICD-10-CM | POA: Diagnosis not present

## 2023-11-03 DIAGNOSIS — Z818 Family history of other mental and behavioral disorders: Secondary | ICD-10-CM

## 2023-11-03 DIAGNOSIS — E785 Hyperlipidemia, unspecified: Secondary | ICD-10-CM | POA: Diagnosis not present

## 2023-11-03 DIAGNOSIS — R9431 Abnormal electrocardiogram [ECG] [EKG]: Secondary | ICD-10-CM | POA: Diagnosis not present

## 2023-11-03 DIAGNOSIS — E781 Pure hyperglyceridemia: Secondary | ICD-10-CM | POA: Diagnosis present

## 2023-11-03 DIAGNOSIS — E1165 Type 2 diabetes mellitus with hyperglycemia: Secondary | ICD-10-CM | POA: Diagnosis present

## 2023-11-03 DIAGNOSIS — I119 Hypertensive heart disease without heart failure: Secondary | ICD-10-CM | POA: Diagnosis present

## 2023-11-03 DIAGNOSIS — I25110 Atherosclerotic heart disease of native coronary artery with unstable angina pectoris: Secondary | ICD-10-CM | POA: Diagnosis present

## 2023-11-03 DIAGNOSIS — F39 Unspecified mood [affective] disorder: Secondary | ICD-10-CM | POA: Diagnosis present

## 2023-11-03 DIAGNOSIS — J4520 Mild intermittent asthma, uncomplicated: Secondary | ICD-10-CM | POA: Diagnosis not present

## 2023-11-03 HISTORY — DX: Unspecified mood (affective) disorder: F39

## 2023-11-03 LAB — BASIC METABOLIC PANEL WITH GFR
Anion gap: 15 (ref 5–15)
BUN: 9 mg/dL (ref 6–20)
CO2: 23 mmol/L (ref 22–32)
Calcium: 9.5 mg/dL (ref 8.9–10.3)
Chloride: 95 mmol/L — ABNORMAL LOW (ref 98–111)
Creatinine, Ser: 1 mg/dL (ref 0.61–1.24)
GFR, Estimated: >60 mL/min (ref >60)
Glucose, Bld: 349 mg/dL — ABNORMAL HIGH (ref 70–99)
Potassium: 3.7 mmol/L (ref 3.5–5.1)
Sodium: 133 mmol/L — ABNORMAL LOW (ref 135–145)

## 2023-11-03 LAB — D-DIMER, QUANTITATIVE: D-Dimer, Quant: <0.27 ug{FEU}/mL (ref 0.00–0.50)

## 2023-11-03 LAB — TROPONIN T, HIGH SENSITIVITY
Troponin T High Sensitivity: 19 ng/L (ref 0–19)
Troponin T High Sensitivity: 22 ng/L — ABNORMAL HIGH (ref 0–19)
Troponin T High Sensitivity: 21 ng/L — ABNORMAL HIGH (ref 0–19)
Troponin T High Sensitivity: 20 ng/L — ABNORMAL HIGH (ref 0–19)

## 2023-11-03 LAB — CBC
HCT: 39.5 % (ref 39.0–52.0)
Hemoglobin: 13.2 g/dL (ref 13.0–17.0)
MCH: 27 pg (ref 26.0–34.0)
MCHC: 33.4 g/dL (ref 30.0–36.0)
MCV: 80.8 fL (ref 80.0–100.0)
Platelets: 288 K/uL (ref 150–400)
RBC: 4.89 MIL/uL (ref 4.22–5.81)
RDW: 13 % (ref 11.5–15.5)
WBC: 7.2 K/uL (ref 4.0–10.5)
nRBC: 0 % (ref 0.0–0.2)

## 2023-11-03 LAB — GLUCOSE, CAPILLARY
Glucose-Capillary: 244 mg/dL — ABNORMAL HIGH (ref 70–99)
Glucose-Capillary: 354 mg/dL — ABNORMAL HIGH (ref 70–99)

## 2023-11-03 LAB — HEMOGLOBIN A1C
Hgb A1c MFr Bld: 12.4 % — ABNORMAL HIGH (ref 4.8–5.6)
Mean Plasma Glucose: 309.18 mg/dL

## 2023-11-03 LAB — TROPONIN I (HIGH SENSITIVITY): Troponin I (High Sensitivity): 3 ng/L (ref <18)

## 2023-11-03 LAB — LIPASE, BLOOD: Lipase: 17 U/L (ref 11–51)

## 2023-11-03 MED ORDER — NITROGLYCERIN 0.4 MG SL SUBL: 0.4000 mg | SUBLINGUAL_TABLET | SUBLINGUAL | Status: DC | PRN

## 2023-11-03 MED ORDER — HYDROXYZINE HCL 10 MG PO TABS
10.0000 mg | ORAL_TABLET | Freq: Every evening | ORAL | Status: DC | PRN
  Administered 2023-11-04: 10 mg via ORAL
  Filled 2023-11-03: qty 1

## 2023-11-03 MED ORDER — INSULIN ASPART 100 UNIT/ML IJ SOLN
0.0000 [IU] | INTRAMUSCULAR | Status: DC
  Administered 2023-11-04: 9 [IU] via SUBCUTANEOUS

## 2023-11-03 MED ORDER — HEPARIN (PORCINE) 25000 UT/250ML-% IV SOLN
1900.0000 [IU]/h | INTRAVENOUS | Status: DC
  Administered 2023-11-03: 1300 [IU]/h via INTRAVENOUS
  Administered 2023-11-05: 1900 [IU]/h via INTRAVENOUS
  Filled 2023-11-03 (×3): qty 250

## 2023-11-03 MED ORDER — INSULIN ASPART 100 UNIT/ML IJ SOLN: 0.0000 [IU] | Freq: Three times a day (TID) | INTRAMUSCULAR | Status: DC

## 2023-11-03 MED ORDER — LAMOTRIGINE 100 MG PO TABS: 50.0000 mg | ORAL_TABLET | Freq: Every day | ORAL | Status: DC

## 2023-11-03 MED ORDER — ASPIRIN 81 MG PO TBEC
81.0000 mg | DELAYED_RELEASE_TABLET | Freq: Every day | ORAL | Status: DC
  Administered 2023-11-04 – 2023-11-05 (×2): 81 mg via ORAL
  Filled 2023-11-03 (×2): qty 1

## 2023-11-03 MED ORDER — FENOFIBRATE 160 MG PO TABS
160.0000 mg | ORAL_TABLET | Freq: Every day | ORAL | Status: DC
Start: 2023-11-04 — End: 2023-11-05
  Administered 2023-11-04 – 2023-11-05 (×2): 160 mg via ORAL
  Filled 2023-11-03 (×2): qty 1

## 2023-11-03 MED ORDER — PANTOPRAZOLE SODIUM 40 MG PO TBEC
40.0000 mg | DELAYED_RELEASE_TABLET | Freq: Every day | ORAL | Status: DC
  Administered 2023-11-03 – 2023-11-05 (×3): 40 mg via ORAL
  Filled 2023-11-03 (×3): qty 1

## 2023-11-03 MED ORDER — HEPARIN BOLUS VIA INFUSION
4000.0000 [IU] | Freq: Once | INTRAVENOUS | Status: AC
  Administered 2023-11-03: 4000 [IU] via INTRAVENOUS

## 2023-11-03 MED ORDER — EMPAGLIFLOZIN 25 MG PO TABS
25.0000 mg | ORAL_TABLET | Freq: Every day | ORAL | Status: DC
Start: 2023-11-04 — End: 2023-11-03

## 2023-11-03 MED ORDER — ACETAMINOPHEN 325 MG PO TABS
650.0000 mg | ORAL_TABLET | Freq: Four times a day (QID) | ORAL | Status: DC | PRN
  Administered 2023-11-04: 650 mg via ORAL
  Filled 2023-11-03: qty 2

## 2023-11-03 MED ORDER — LAMOTRIGINE 25 MG PO TABS
25.0000 mg | ORAL_TABLET | Freq: Every day | ORAL | Status: DC
  Administered 2023-11-03 – 2023-11-04 (×2): 25 mg via ORAL
  Filled 2023-11-03 (×3): qty 1

## 2023-11-03 MED ORDER — ASPIRIN 81 MG PO TBEC: 81.0000 mg | DELAYED_RELEASE_TABLET | Freq: Every day | ORAL | Status: DC

## 2023-11-03 MED ORDER — IOHEXOL 350 MG/ML SOLN
100.0000 mL | Freq: Once | INTRAVENOUS | Status: AC | PRN
  Administered 2023-11-03: 100 mL via INTRAVENOUS

## 2023-11-03 MED ORDER — ATORVASTATIN CALCIUM 10 MG PO TABS: 20.0000 mg | ORAL_TABLET | Freq: Every day | ORAL | Status: DC

## 2023-11-03 MED ORDER — INSULIN ASPART 100 UNIT/ML IJ SOLN
0.0000 [IU] | Freq: Every day | INTRAMUSCULAR | Status: DC
  Administered 2023-11-03: 2 [IU] via SUBCUTANEOUS

## 2023-11-03 MED ORDER — ONDANSETRON HCL 4 MG/2ML IJ SOLN: 4.0000 mg | Freq: Four times a day (QID) | INTRAMUSCULAR | Status: DC | PRN

## 2023-11-03 MED ORDER — NITROGLYCERIN 0.4 MG SL SUBL
0.4000 mg | SUBLINGUAL_TABLET | Freq: Once | SUBLINGUAL | Status: AC
  Administered 2023-11-03: 0.4 mg via SUBLINGUAL
  Filled 2023-11-03: qty 1

## 2023-11-03 MED ORDER — ASPIRIN 81 MG PO CHEW
324.0000 mg | CHEWABLE_TABLET | Freq: Once | ORAL | Status: AC
  Administered 2023-11-03: 324 mg via ORAL
  Filled 2023-11-03: qty 4

## 2023-11-03 NOTE — ED Provider Notes (Signed)
 Per my evaluation.  Troponin did slightly increase to 22.  Still having some mild chest pain that improved with nitroglycerin .  I had a CT dissection study that was unremarkable.  Talked with Dr. Shlomo given family history of cardiac disease in the 30s.  Very high triglycerides patient has with TGs about thousand a couple months ago.  He does have history of diabetes that is not well-controlled currently.  Overall talked with Dr. Shlomo and did recommend starting heparin  and get him admitted to medicine though will evaluate.  Given significant medical history we will treat as unstable angina.  Hemodynamically stable throughout my care.  This chart was dictated using voice recognition software.  Despite best efforts to proofread,  errors can occur which can change the documentation meaning.  .Critical Care  Performed by: Ruthe Cornet, DO Authorized by: Ruthe Cornet, DO   Critical care provider statement:    Critical care time (minutes):  35   Critical care was necessary to treat or prevent imminent or life-threatening deterioration of the following conditions:  Cardiac failure   Critical care was time spent personally by me on the following activities:  Blood draw for specimens, development of treatment plan with patient or surrogate, discussions with primary provider, discussions with consultants, evaluation of patient's response to treatment, examination of patient, obtaining history from patient or surrogate, ordering and performing treatments and interventions, ordering and review of laboratory studies, ordering and review of radiographic studies, pulse oximetry, re-evaluation of patient's condition and review of old charts   Care discussed with: admitting provider       Ruthe Cornet, DO 11/03/23 1814

## 2023-11-03 NOTE — Plan of Care (Addendum)
 [facility]  to [facility] progressive unit transfer:  33 year old man past medical history of DM type I, hyperlipidemia, bipolar disorder and generalized anxiety disorder presented emergency department complaining of chest tightness shortness of breath and lower back pain for last 24 hours while patient was at work.  Chest pain persisted and patient also feels generalized fatigue.  Presented to emergency department complaining about chest pain.  Presentation to ED patient found borderline hypertensive otherwise hemodynamically stable. EKG showed normal sinus rhythm heart rate 88 and ST depression in lead III aVF. Flat troponin.  Normal D-dimer level.  CBC remarkable.  BMP showing elevated blood glucose 349.  CTA chest no evidence of PE.  No evidence of thoracoabdominal aortic aneurysm or dissection.  No other abnormality. CT abdomen pelvis unremarkable Chest x-ray no acute disease process.  ED physician Dr.Curatolo discussed case with on-call cardiology Dr. Shlomo, recommended starting heparin  drip and admit to medicine and cardiology will evaluate.  In the ED patient has been given aspirin  load, started on heparin  drip and sublingual nitroglycerin .  ED physician reported that patient is currently chest pain-free.  Hospitalist has been consulted for further evaluation management of NSTEMI.  Please inform on-call cardiology upon arrival to hospital.  Ricky Savas, MD [facility] 11/03/2023, 7:29 PM

## 2023-11-03 NOTE — Consult Note (Signed)
 Cardiology Consultation   Patient ID: Aarnav Steagall MRN: [id number]; DOB: 1990-04-26  Admit date: 11/03/2023 Date of Consult: 11/03/2023  PCP:  Colette Torrence GRADE, MD   [facility] Providers Cardiologist:  None   { Click here to update MD or APP on Care Team, Refresh:1}     Patient Profile: Ricky [name] is a 33 y.o. male with a hx of diabetes and hyperlipdemia who is being seen 11/03/2023 for the evaluation of chest pain at the request of hospitalist service.  History of Present Illness: Mr. Aull presents with chest tightness and SOB that started yesterday.  In ED Chest CTA without PE or dissection Troponins 22 to 21 to 20 #ECG shows Q waves V1-3, TWI in inferolateral leads.  Of note, blood sugars quite elevated, also last triglyceride measured >1000, A1c of 15  No prior cardiac history, but Family Hx of premature CAD Started on heparin  per daytime cardiology recommendations.   Past Medical History:  Diagnosis Date   Asthma    Diabetes mellitus without complication (HCC)    Mood disorder (HCC)     Past Surgical History:  Procedure Laterality Date   WISDOM TOOTH EXTRACTION       {Home Medications (Optional):21181}  Scheduled Meds:  [START ON 11/04/2023] aspirin  EC  81 mg Oral Daily   [START ON 11/04/2023] atorvastatin   20 mg Oral Daily   [START ON 11/04/2023] empagliflozin   25 mg Oral Daily   [START ON 11/04/2023] fenofibrate   160 mg Oral Daily   [START ON 11/04/2023] insulin  aspart  0-15 Units Subcutaneous TID WC   insulin  aspart  0-5 Units Subcutaneous QHS   lamoTRIgine   25 mg Oral Daily   pantoprazole   40 mg Oral Daily   Continuous Infusions:  heparin  1,300 Units/hr (11/03/23 1800)   PRN Meds: acetaminophen , hydrOXYzine , nitroGLYCERIN , ondansetron  (ZOFRAN ) IV  Allergies:   No Known Allergies  Social History:   Social History   Socioeconomic History   Marital status: Single    Spouse name: Not on file   Number of children: 0    Years of education: Not on file   Highest education level: Not on file  Occupational History   Not on file  Tobacco Use   Smoking status: Never    Passive exposure: Never   Smokeless tobacco: Never  Vaping Use   Vaping status: Some Days  Substance and Sexual Activity   Alcohol use: Yes    Comment: occ   Drug use: No   Sexual activity: Not Currently  Other Topics Concern   Not on file  Social History Narrative   Not on file   Social Drivers of Health   Financial Resource Strain: Low Risk  (03/28/2022)   Received from [facility]   Overall Financial Resource Strain (CARDIA)    Difficulty of Paying Living Expenses: Not hard at all  Food Insecurity: No Food Insecurity (03/28/2022)   Received from [facility]   Hunger Vital Sign    Within the past 12 months, you worried that your food would run out before you got the money to buy more.: Never true    Within the past 12 months, the food you bought just didn't last and you didn't have money to get more.: Never true  Transportation Needs: No Transportation Needs (03/28/2022)   Received from [facility] - Transportation    Lack of Transportation (Medical): No    Lack of Transportation (Non-Medical): No  Physical Activity: Sufficiently Active (03/28/2022)  Received from [facility]   Exercise Vital Sign    On average, how many days per week do you engage in moderate to strenuous exercise (like a brisk walk)?: 5 days    On average, how many minutes do you engage in exercise at this level?: 30 min  Stress: Stress Concern Present (03/28/2022)   Received from [facility] - Occupational Stress Questionnaire    Feeling of Stress : To some extent  Social Connections: Socially Integrated (03/28/2022)   Received from [facility]   Social Network    How would you rate your social network (family, work, friends)?: Good participation with social networks  Intimate Partner Violence:  Not At Risk (03/28/2022)   Received from [facility]   HITS    Over the last 12 months how often did your partner physically hurt you?: Never    Over the last 12 months how often did your partner insult you or talk down to you?: Never    Over the last 12 months how often did your partner threaten you with physical harm?: Never    Over the last 12 months how often did your partner scream or curse at you?: Never    Family History:   *** Family History  Problem Relation Age of Onset   Bipolar disorder Mother    CAD Mother    CAD Father    Schizophrenia Maternal Grandmother    CAD Other      ROS:  Please see the history of present illness.  *** All other ROS reviewed and negative.     Physical Exam/Data: Vitals:   11/03/23 1900 11/03/23 1930 11/03/23 2015 11/03/23 2143  BP: (!) 142/92 (!) 139/91  (!) 145/97  Pulse: 92 91    Resp: 10 (!) 22  16  Temp:   98.1 F (36.7 C) 97.8 F (36.6 C)  TempSrc:   Oral Oral  SpO2: 100% 100%  100%  Weight:    109.5 kg  Height:    5' 11.5 (1.816 m)    Intake/Output Summary (Last 24 hours) at 11/03/2023 2336 Last data filed at 11/03/2023 2258 Gross per 24 hour  Intake --  Output 300 ml  Net -300 ml      11/03/2023    9:43 PM 11/03/2023   11:28 AM 07/25/2023    9:29 AM  Last 3 Weights  Weight (lbs) 241 lb 6.4 oz 250 lb 247 lb 11.2 oz  Weight (kg) 109.498 kg 113.399 kg 112.356 kg     Body mass index is 33.2 kg/m.  General:  Well nourished, well developed, in no acute distress*** HEENT: normal Neck: no JVD Vascular: No carotid bruits; Distal pulses 2+ bilaterally Cardiac:  normal S1, S2; RRR; no murmur *** Lungs:  clear to auscultation bilaterally, no wheezing, rhonchi or rales  Abd: soft, nontender, no hepatomegaly  Ext: no edema Musculoskeletal:  No deformities, BUE and BLE strength normal and equal Skin: warm and dry  Neuro:  CNs 2-12 intact, no focal abnormalities noted Psych:  Normal affect   EKG:  The EKG was personally  reviewed and demonstrates:  *** Telemetry:  Telemetry was personally reviewed and demonstrates:  ***  Relevant CV Studies: ***  Laboratory Data: High Sensitivity Troponin:   Recent Labs  Lab 11/03/23 2231  TROPONINIHS 3     Chemistry Recent Labs  Lab 11/03/23 1138  NA 133*  K 3.7  CL 95*  CO2 23  GLUCOSE 349*  BUN 9  CREATININE 1.00  CALCIUM  9.5  GFRNONAA >60  ANIONGAP 15    No results for input(s): PROT, ALBUMIN, AST, ALT, ALKPHOS, BILITOT in the last 168 hours. Lipids No results for input(s): CHOL, TRIG, HDL, LABVLDL, LDLCALC, CHOLHDL in the last 168 hours.  Hematology Recent Labs  Lab 11/03/23 1138  WBC 7.2  RBC 4.89  HGB 13.2  HCT 39.5  MCV 80.8  MCH 27.0  MCHC 33.4  RDW 13.0  PLT 288   Thyroid No results for input(s): TSH, FREET4 in the last 168 hours.  BNPNo results for input(s): BNP, PROBNP in the last 168 hours.  DDimer  Recent Labs  Lab 11/03/23 1501  DDIMER <0.27    Radiology/Studies:  CT Angio Chest/Abd/Pel for Dissection W and/or Wo Contrast Result Date: 11/03/2023 CLINICAL DATA:  Chest tightness, short of breath, lower back pain yesterday while at work, generalized fatigue EXAM: CT ANGIOGRAPHY CHEST, ABDOMEN AND PELVIS TECHNIQUE: Non-contrast CT of the chest was initially obtained. Multidetector CT imaging through the chest, abdomen and pelvis was performed using the standard protocol during bolus administration of intravenous contrast. Multiplanar reconstructed images and MIPs were obtained and reviewed to evaluate the vascular anatomy. RADIATION DOSE REDUCTION: This exam was performed according to the departmental dose-optimization program which includes automated exposure control, adjustment of the mA and/or kV according to patient size and/or use of iterative reconstruction technique. CONTRAST:  OMNIPAQUE  IOHEXOL  350 MG/ML SOLN COMPARISON:  11/03/2023 FINDINGS: CTA CHEST FINDINGS Cardiovascular: The heart is  unremarkable without pericardial effusion. No evidence of thoracic aortic aneurysm or dissection. There is technically adequate opacification of the pulmonary vasculature. No filling defects or pulmonary emboli. Mediastinum/Nodes: No enlarged mediastinal, hilar, or axillary lymph nodes. Thyroid gland, trachea, and esophagus demonstrate no significant findings. Lungs/Pleura: No acute airspace disease, effusion, or pneumothorax. Central airways are patent. Musculoskeletal: No acute or destructive bony abnormalities. Reconstructed images demonstrate no additional findings. Review of the MIP images confirms the above findings. CTA ABDOMEN AND PELVIS FINDINGS VASCULAR Aorta: Normal caliber aorta without aneurysm, dissection, vasculitis or significant stenosis. Celiac: Patent without evidence of aneurysm, dissection, vasculitis or significant stenosis. SMA: Patent without evidence of aneurysm, dissection, vasculitis or significant stenosis. Renals: Both renal arteries are patent without evidence of aneurysm, dissection, vasculitis, fibromuscular dysplasia or significant stenosis. IMA: Patent without evidence of aneurysm, dissection, vasculitis or significant stenosis. Inflow: Patent without evidence of aneurysm, dissection, vasculitis or significant stenosis. Veins: No obvious venous abnormality within the limitations of this arterial phase study. Review of the MIP images confirms the above findings. NON-VASCULAR Hepatobiliary: No focal liver abnormality is seen. No gallstones, gallbladder wall thickening, or biliary dilatation. Pancreas: Unremarkable. No pancreatic ductal dilatation or surrounding inflammatory changes. Spleen: Normal in size without focal abnormality. Adrenals/Urinary Tract: No urinary tract calculi or obstructive uropathy within either kidney. Kidneys enhance normally. The adrenals and bladder are unremarkable. Stomach/Bowel: No bowel obstruction or ileus. Normal appendix right lower quadrant. No bowel  wall thickening or inflammatory change. Lymphatic: No pathologic adenopathy. Reproductive: Prostate is unremarkable. Other: No free fluid or free intraperitoneal gas. No abdominal wall hernia. Musculoskeletal: No acute or destructive bony abnormalities. Reconstructed images demonstrate no additional findings. Review of the MIP images confirms the above findings. IMPRESSION: 1. No evidence of thoracoabdominal aortic aneurysm or dissection. 2. No evidence of pulmonary embolus. 3. No acute intrathoracic, intra-abdominal, or intrapelvic process. Electronically Signed   By: Ozell Daring M.D.   On: 11/03/2023 17:21   DG Chest 2 View Result Date:  11/03/2023 EXAM: 2 VIEW(S) XRAY OF THE CHEST 11/03/2023 11:48:00 AM COMPARISON: 1 view chest x-ray 03/09/2023. CLINICAL HISTORY: Chest tightness, shortness of breath, and lower back pain onset yesterday while at work. Symptoms have persisted since and now patient feels generalized fatigue. FINDINGS: LUNGS AND PLEURA: Clear lungs. No pneumothorax or pleural effusion. HEART AND MEDIASTINUM: No acute abnormality of the cardiac and mediastinal silhouettes. BONES AND SOFT TISSUES: No acute osseous abnormality. IMPRESSION: 1. No acute process. Electronically signed by: Lonni Necessary MD 11/03/2023 12:19 PM EDT RP Workstation: [id number]     Assessment and Plan: Unstable angina - agree with heparin  for ACS indication - likely will undergo cath on Monday (please make NPO Sunday night) - hold metformin  for now. - ASA, high intensity statin - currently has enough bp room to start lisinopril .   Risk Assessment/Risk Scores: {Complete the following score calculators/questions to meet required metrics.  Press F2         :[id number]}   TIMI Risk Score for Unstable Angina or Non-ST Elevation MI:   The patient's TIMI risk score is 2, which indicates a 8% risk of all cause mortality, new or recurrent myocardial infarction or need for urgent revascularization in the next 14  days.{ Click here to calculate score  REFRESH Note before signing  :1}         For questions or updates, please contact [facility] Please consult [email or web address] for contact info under    {TIP  Split Shared Billing  Do NOT delete any part of this including brackets If split shared billing is based upon MDM, disregard If billing will be based upon TIME you MUST document the number of minutes and a detailed list of what was done in that time in the following format Example - I spent ** minutes seeing this patient. During that time I reviewed their history, evaluated their symptoms, reviewed available labs, EKGs, studies, performed an exam and formulated an assessment and plan   :1} {Select this only if you need to document critical care time (Optional):[phone]} Signed, Andee Flatten, MD  11/03/2023 11:36 PM

## 2023-11-03 NOTE — ED Triage Notes (Signed)
 Chest tightness, shob and lower back pain onset yesterday while at work. Sx have persisted since and now patient feels generalized fatigue.

## 2023-11-03 NOTE — ED Notes (Signed)
 [facility] called for transport.

## 2023-11-03 NOTE — Progress Notes (Signed)
 PHARMACY - ANTICOAGULATION CONSULT NOTE  Pharmacy Consult for heparin  Indication: chest pain/ACS  No Known Allergies  Patient Measurements: Height: 5' 11 (180.3 cm) Weight: 113.4 kg (250 lb) IBW/kg (Calculated) : 75.3 HEPARIN  DW (KG): 99.9  Vital Signs: Temp: 97.8 F (36.6 C) (09/13 1400) Temp Source: Oral (09/13 1400) BP: 157/97 (09/13 1715) Pulse Rate: 94 (09/13 1715)  Labs: Recent Labs    11/03/23 1138  HGB 13.2  HCT 39.5  PLT 288  CREATININE 1.00    Estimated Creatinine Clearance: 137 mL/min (by C-G formula based on SCr of 1 mg/dL).   Medical History: Past Medical History:  Diagnosis Date   Asthma    Diabetes mellitus without complication (HCC)    Mood disorder (HCC)      Assessment: 49 YOM presenting with chest tightness, he is not on anticoagulation PTA< CBC wnl  Goal of Therapy:  Heparin  level 0.3-0.7 units/ml Monitor platelets by anticoagulation protocol: Yes   Plan:  Heparin  4000 units Iv x 1, and gtt at 1300 units/hr F/u 6 hour heparin  level F/u cards eval and recs  Dorn Poot, PharmD, [facility] Clinical Pharmacist ED Pharmacist Phone # [phone] 11/03/2023 5:49 PM

## 2023-11-03 NOTE — H&P (Addendum)
 History and Physical    Ricky [name] FMW:[id number] DOB: Jul 13, 1990 DOA: 11/03/2023  PCP: Colette Torrence GRADE, MD  Patient coming from: [facility] ED  Chief Complaint: Chest pain  HPI: Ricky [name] is a 33 y.o. male with medical history significant of asthma, insulin -dependent type 2 diabetes, hyperlipidemia, mood disorder presenting with a chief complaint of chest pain.  Patient is reporting history of intermittent episodes of substernal/left-sided chest tightness and pressure associated with dyspnea for several months and symptoms are not necessarily associated with exertion.  He works at [employer] and yesterday at work he started having chest tightness again and this time it lasted all day long prompting him to go to the ED to be evaluated.  He does report improvement after receiving sublingual nitroglycerin  in the ED.  He reports strong family history of coronary artery disease, both parents and maternal/paternal grandparents.  No other complaints.  ED Course: Hemodynamically stable.  Labs showing mild stable chronic hyponatremia, glucose 349, bicarb 23, anion gap 15, troponin 19> 22> 21> 20, D-dimer negative.  CTA chest/abdomen/pelvis negative for PE, aortic aneurysm, or dissection.  EKG showing sinus rhythm and new T wave inversions in inferior leads compared to previous EKG from April 2023.  Cardiology will consult.  Recommended starting heparin  drip for possible NSTEMI and admission to medicine service.  Patient was given aspirin  324 mg, sublingual nitroglycerin , and started on heparin  drip.  Review of Systems:  Review of Systems  All other systems reviewed and are negative.   Past Medical History:  Diagnosis Date   Asthma    Diabetes mellitus without complication (HCC)    Mood disorder (HCC)     Past Surgical History:  Procedure Laterality Date   WISDOM TOOTH EXTRACTION       reports that he has never smoked. He has never been exposed to tobacco smoke. He has never used  smokeless tobacco. He reports current alcohol use. He reports that he does not use drugs.  No Known Allergies  Family History  Problem Relation Age of Onset   Bipolar disorder Mother    CAD Mother    CAD Father    Schizophrenia Maternal Grandmother    CAD Other     Prior to Admission medications   Medication Sig Start Date End Date Taking? Authorizing Provider  hydrOXYzine  (ATARAX ) 10 MG tablet Take 1 tablet (10 mg total) by mouth at bedtime as needed. Patient taking differently: Take 10 mg by mouth at bedtime as needed for anxiety. 10/10/23  Yes Ezzard Staci SAILOR, NP  Continuous Glucose Receiver (DEXCOM G7 RECEIVER) DEVI Use for checking sugars 07/25/23   Colette Torrence GRADE, MD  Continuous Glucose Sensor (DEXCOM G7 SENSOR) MISC Check sugars daily 07/25/23   Colette Torrence GRADE, MD  empagliflozin  (JARDIANCE ) 25 MG TABS tablet Take 1 tablet (25 mg total) by mouth daily. 07/12/23   Colette Torrence GRADE, MD  fenofibrate  (TRICOR ) 145 MG tablet Take 1 tablet (145 mg total) by mouth daily. 07/12/23   Colette Torrence GRADE, MD  insulin  glargine (LANTUS ) 100 UNIT/ML injection Inject 0.4 mLs (40 Units total) into the skin daily. 07/25/23 01/21/24  Colette Torrence GRADE, MD  lamoTRIgine  (LAMICTAL ) 25 MG tablet Take 1 tablet (25 mg total) by mouth daily for 7 days, THEN 2 tablets (50 mg total) daily. Patient taking differently: Take one tablet by mouth twice daily 10/10/23 11/16/23  Lewis, Tanika N, NP  metFORMIN  (GLUCOPHAGE -XR) 500 MG 24 hr tablet TAKE 2 TABLETS (1,000 MG TOTAL) BY MOUTH 2 (TWO) TIMES  DAILY WITH A MEAL. 08/16/23   Colette Torrence GRADE, MD    Physical Exam: Vitals:   11/03/23 1900 11/03/23 1930 11/03/23 2015 11/03/23 2143  BP: (!) 142/92 (!) 139/91  (!) 145/97  Pulse: 92 91    Resp: 10 (!) 22  16  Temp:   98.1 F (36.7 C) 97.8 F (36.6 C)  TempSrc:   Oral Oral  SpO2: 100% 100%  100%  Weight:    109.5 kg  Height:    5' 11.5 (1.816 m)    Physical Exam Vitals reviewed.  Constitutional:      General:  He is not in acute distress. HENT:     Head: Normocephalic and atraumatic.  Eyes:     Extraocular Movements: Extraocular movements intact.  Cardiovascular:     Rate and Rhythm: Normal rate and regular rhythm.     Heart sounds: Normal heart sounds.  Pulmonary:     Effort: Pulmonary effort is normal. No respiratory distress.     Breath sounds: Normal breath sounds.  Abdominal:     General: Bowel sounds are normal. There is no distension.     Palpations: Abdomen is soft.     Tenderness: There is no abdominal tenderness.  Musculoskeletal:     Cervical back: Normal range of motion.     Right lower leg: No edema.     Left lower leg: No edema.  Skin:    General: Skin is warm and dry.  Neurological:     General: No focal deficit present.     Mental Status: He is alert and oriented to person, place, and time.     Labs on Admission: I have personally reviewed following labs and imaging studies  CBC: Recent Labs  Lab 11/03/23 1138  WBC 7.2  HGB 13.2  HCT 39.5  MCV 80.8  PLT 288   Basic Metabolic Panel: Recent Labs  Lab 11/03/23 1138  NA 133*  K 3.7  CL 95*  CO2 23  GLUCOSE 349*  BUN 9  CREATININE 1.00  CALCIUM  9.5   GFR: Estimated Creatinine Clearance: 135.8 mL/min (by C-G formula based on SCr of 1 mg/dL). Liver Function Tests: No results for input(s): AST, ALT, ALKPHOS, BILITOT, PROT, ALBUMIN in the last 168 hours. Recent Labs  Lab 11/03/23 1501  LIPASE 17   No results for input(s): AMMONIA in the last 168 hours. Coagulation Profile: No results for input(s): INR, PROTIME in the last 168 hours. Cardiac Enzymes: No results for input(s): CKTOTAL, CKMB, CKMBINDEX, TROPONINI in the last 168 hours. BNP (last 3 results) No results for input(s): PROBNP in the last 8760 hours. HbA1C: No results for input(s): HGBA1C in the last 72 hours. CBG: Recent Labs  Lab 11/03/23 2146  GLUCAP 244*   Lipid Profile: No results for input(s):  CHOL, HDL, LDLCALC, TRIG, CHOLHDL, LDLDIRECT in the last 72 hours. Thyroid Function Tests: No results for input(s): TSH, T4TOTAL, FREET4, T3FREE, THYROIDAB in the last 72 hours. Anemia Panel: No results for input(s): VITAMINB12, FOLATE, FERRITIN, TIBC, IRON, RETICCTPCT in the last 72 hours. Urine analysis: No results found for: COLORURINE, APPEARANCEUR, LABSPEC, PHURINE, GLUCOSEU, HGBUR, BILIRUBINUR, KETONESUR, PROTEINUR, UROBILINOGEN, NITRITE, LEUKOCYTESUR  Radiological Exams on Admission: CT Angio Chest/Abd/Pel for Dissection W and/or Wo Contrast Result Date: 11/03/2023 CLINICAL DATA:  Chest tightness, short of breath, lower back pain yesterday while at work, generalized fatigue EXAM: CT ANGIOGRAPHY CHEST, ABDOMEN AND PELVIS TECHNIQUE: Non-contrast CT of the chest was initially obtained. Multidetector CT imaging through the chest, abdomen  and pelvis was performed using the standard protocol during bolus administration of intravenous contrast. Multiplanar reconstructed images and MIPs were obtained and reviewed to evaluate the vascular anatomy. RADIATION DOSE REDUCTION: This exam was performed according to the departmental dose-optimization program which includes automated exposure control, adjustment of the mA and/or kV according to patient size and/or use of iterative reconstruction technique. CONTRAST:  OMNIPAQUE  IOHEXOL  350 MG/ML SOLN COMPARISON:  11/03/2023 FINDINGS: CTA CHEST FINDINGS Cardiovascular: The heart is unremarkable without pericardial effusion. No evidence of thoracic aortic aneurysm or dissection. There is technically adequate opacification of the pulmonary vasculature. No filling defects or pulmonary emboli. Mediastinum/Nodes: No enlarged mediastinal, hilar, or axillary lymph nodes. Thyroid gland, trachea, and esophagus demonstrate no significant findings. Lungs/Pleura: No acute airspace disease, effusion, or pneumothorax.  Central airways are patent. Musculoskeletal: No acute or destructive bony abnormalities. Reconstructed images demonstrate no additional findings. Review of the MIP images confirms the above findings. CTA ABDOMEN AND PELVIS FINDINGS VASCULAR Aorta: Normal caliber aorta without aneurysm, dissection, vasculitis or significant stenosis. Celiac: Patent without evidence of aneurysm, dissection, vasculitis or significant stenosis. SMA: Patent without evidence of aneurysm, dissection, vasculitis or significant stenosis. Renals: Both renal arteries are patent without evidence of aneurysm, dissection, vasculitis, fibromuscular dysplasia or significant stenosis. IMA: Patent without evidence of aneurysm, dissection, vasculitis or significant stenosis. Inflow: Patent without evidence of aneurysm, dissection, vasculitis or significant stenosis. Veins: No obvious venous abnormality within the limitations of this arterial phase study. Review of the MIP images confirms the above findings. NON-VASCULAR Hepatobiliary: No focal liver abnormality is seen. No gallstones, gallbladder wall thickening, or biliary dilatation. Pancreas: Unremarkable. No pancreatic ductal dilatation or surrounding inflammatory changes. Spleen: Normal in size without focal abnormality. Adrenals/Urinary Tract: No urinary tract calculi or obstructive uropathy within either kidney. Kidneys enhance normally. The adrenals and bladder are unremarkable. Stomach/Bowel: No bowel obstruction or ileus. Normal appendix right lower quadrant. No bowel wall thickening or inflammatory change. Lymphatic: No pathologic adenopathy. Reproductive: Prostate is unremarkable. Other: No free fluid or free intraperitoneal gas. No abdominal wall hernia. Musculoskeletal: No acute or destructive bony abnormalities. Reconstructed images demonstrate no additional findings. Review of the MIP images confirms the above findings. IMPRESSION: 1. No evidence of thoracoabdominal aortic aneurysm or  dissection. 2. No evidence of pulmonary embolus. 3. No acute intrathoracic, intra-abdominal, or intrapelvic process. Electronically Signed   By: Ozell Daring M.D.   On: 11/03/2023 17:21   DG Chest 2 View Result Date: 11/03/2023 EXAM: 2 VIEW(S) XRAY OF THE CHEST 11/03/2023 11:48:00 AM COMPARISON: 1 view chest x-ray 03/09/2023. CLINICAL HISTORY: Chest tightness, shortness of breath, and lower back pain onset yesterday while at work. Symptoms have persisted since and now patient feels generalized fatigue. FINDINGS: LUNGS AND PLEURA: Clear lungs. No pneumothorax or pleural effusion. HEART AND MEDIASTINUM: No acute abnormality of the cardiac and mediastinal silhouettes. BONES AND SOFT TISSUES: No acute osseous abnormality. IMPRESSION: 1. No acute process. Electronically signed by: Lonni Necessary MD 11/03/2023 12:19 PM EDT RP Workstation: [id number]    Assessment and Plan  Chest pain Hemodynamically stable.  CT chest/abdomen/pelvis negative for PE, aortic aneurysm, or dissection.  EKG showing new T wave inversions in inferior leads compared to previous EKG from April 2023.  Troponin minimally elevated and stable.  Does have risk factors including very poorly controlled type 2 diabetes, hyperlipidemia/severe hypertriglyceridemia, and strong family history of CAD.  Cardiology consulted and recommended starting heparin  drip for possible NSTEMI.  Patient was given aspirin  324 mg, sublingual nitroglycerin , and started  on heparin  drip in the ED. Chest pain has improved after sublingual nitroglycerin .  Continue heparin  drip and aspirin , sublingual nitroglycerin  as needed.  Keep n.p.o. after midnight.  Echocardiogram ordered.  Asthma Stable, no signs of acute exacerbation.  Does not use inhalers at home.  Uncontrolled insulin -dependent type 2 diabetes with hyperglycemia Most recent CBG in the 240s.  Last A1c 14.9 on 07/11/2023, repeat ordered.  Continue home long-acting insulin .  Placed on sensitive sliding  scale insulin  every 4 hours for now as patient will be n.p.o. after midnight.  Hypertriglyceridemia Lipid panel done 07/11/2023 showing triglycerides >1000.  Repeat lipid panel ordered.  Continue fenofibrate .  Mood disorder Continue home meds.  DVT prophylaxis: IV heparin  gtt Code Status: Full Code (discussed with the patient) Family Communication: No family available at this time. Consults called: Cardiology Level of care: Progressive Care Unit Admission status: It is my clinical opinion that admission to INPATIENT is reasonable and necessary because of the expectation that this patient will require hospital care that crosses at least 2 midnights to treat this condition based on the medical complexity of the problems presented.  Given the aforementioned information, the predictability of an adverse outcome is felt to be significant.   Editha Ram MD [facility]  If 7PM-7AM, please contact night-coverage [email or web address]  11/03/2023, 10:53 PM

## 2023-11-03 NOTE — Plan of Care (Addendum)
 Patient has history of bipolar disorder requesting for home Lamictal .  Patient verified he takes 25 mg at bedtime daily.

## 2023-11-03 NOTE — ED Notes (Signed)
 Patient transported to [facility] via [facility] at this time

## 2023-11-03 NOTE — ED Provider Notes (Signed)
 [facility] Provider Note   CSN: [id number] Arrival date & time: 11/03/23  1113     Patient presents with: Chest Pain and Back Pain   Ricky [name] is a 33 y.o. male.  {Add pertinent medical, surgical, social history, OB history to HPI:32947} HPI      Thursday developed chest pain, tightness  Lower back started yesterday, pain with movements  Everything difficult to do, no energy  Chest to epigastric area Dyspnea started Thursday Hip pain, no other leg pain or swelling No nausea or vomiting, no diaphoresis  Not sure what makes pain better or worse. Last night standing, sitting laying nothing helped Not positional, pleuritic or exertional No numbness, weakness, loss control of bowels or bladder, no falls or trauma No long trips, recent surgeries, hx dvt/pe No fever or cough No hx ivdu No smoking, other drugs or drinking Yes family hx of early heart disease - mom had heart disesase in her 30s, dad also had it, grandma  DM, asthma No known hx of htn.   Past Medical History:  Diagnosis Date   Asthma    Diabetes mellitus without complication (HCC)    Mood disorder (HCC)      Prior to Admission medications   Medication Sig Start Date End Date Taking? Authorizing Provider  atorvastatin  (LIPITOR) 20 MG tablet TAKE 1 TABLET BY MOUTH EVERYDAY AT BEDTIME 08/16/23   Colette Torrence GRADE, MD  Continuous Glucose Receiver (DEXCOM G7 RECEIVER) DEVI Use for checking sugars 07/25/23   Colette Torrence GRADE, MD  Continuous Glucose Sensor (DEXCOM G7 SENSOR) MISC Check sugars daily 07/25/23   Colette Torrence GRADE, MD  empagliflozin  (JARDIANCE ) 25 MG TABS tablet Take 1 tablet (25 mg total) by mouth daily. 07/12/23   Colette Torrence GRADE, MD  fenofibrate  (TRICOR ) 145 MG tablet Take 1 tablet (145 mg total) by mouth daily. 07/12/23   Colette Torrence GRADE, MD  hydrOXYzine  (ATARAX ) 10 MG tablet Take 1 tablet (10 mg total) by mouth at bedtime as needed. 10/10/23    Ezzard Staci SAILOR, NP  insulin  glargine (LANTUS ) 100 UNIT/ML injection Inject 0.4 mLs (40 Units total) into the skin daily. 07/25/23 01/21/24  Colette Torrence GRADE, MD  lamoTRIgine  (LAMICTAL ) 25 MG tablet Take 1 tablet (25 mg total) by mouth daily for 7 days, THEN 2 tablets (50 mg total) daily. 10/10/23 11/16/23  Ezzard Staci SAILOR, NP  metFORMIN  (GLUCOPHAGE -XR) 500 MG 24 hr tablet TAKE 2 TABLETS (1,000 MG TOTAL) BY MOUTH 2 (TWO) TIMES DAILY WITH A MEAL. 08/16/23   Colette Torrence GRADE, MD    Allergies: Patient has no known allergies.    Review of Systems  Updated Vital Signs BP (!) 156/84   Pulse 93   Temp 97.8 F (36.6 C) (Oral)   Resp 14   Ht 5' 11 (1.803 m)   Wt 113.4 kg   SpO2 100%   BMI 34.87 kg/m   Physical Exam  (all labs ordered are listed, but only abnormal results are displayed) Labs Reviewed  BASIC METABOLIC PANEL WITH GFR - Abnormal; Notable for the following components:      Result Value   Sodium 133 (*)    Chloride 95 (*)    Glucose, Bld 349 (*)    All other components within normal limits  CBC  TROPONIN T, HIGH SENSITIVITY  TROPONIN T, HIGH SENSITIVITY    EKG: None  Radiology: DG Chest 2 View Result Date: 11/03/2023 EXAM: 2 VIEW(S) XRAY OF THE CHEST  11/03/2023 11:48:00 AM COMPARISON: 1 view chest x-ray 03/09/2023. CLINICAL HISTORY: Chest tightness, shortness of breath, and lower back pain onset yesterday while at work. Symptoms have persisted since and now patient feels generalized fatigue. FINDINGS: LUNGS AND PLEURA: Clear lungs. No pneumothorax or pleural effusion. HEART AND MEDIASTINUM: No acute abnormality of the cardiac and mediastinal silhouettes. BONES AND SOFT TISSUES: No acute osseous abnormality. IMPRESSION: 1. No acute process. Electronically signed by: Lonni Necessary MD 11/03/2023 12:19 PM EDT RP Workstation: [id number]    {Document cardiac monitor, telemetry assessment procedure when appropriate:32947} Procedures   Medications Ordered in the ED - No  data to display    {Click here for ABCD2, HEART and other calculators REFRESH Note before signing:1}                              Medical Decision Making Amount and/or Complexity of Data Reviewed Labs: ordered. Radiology: ordered.   ***  {Document critical care time when appropriate  Document review of labs and clinical decision tools ie [id number], etc  Document your independent review of radiology images and any outside records  Document your discussion with family members, caretakers and with consultants  Document social determinants of health affecting pt's care  Document your decision making why or why not admission, treatments were needed:32947:::1}   Final diagnoses:  None    ED Discharge Orders     None

## 2023-11-04 ENCOUNTER — Inpatient Hospital Stay (HOSPITAL_COMMUNITY)

## 2023-11-04 DIAGNOSIS — Z794 Long term (current) use of insulin: Secondary | ICD-10-CM

## 2023-11-04 DIAGNOSIS — R0602 Shortness of breath: Secondary | ICD-10-CM | POA: Diagnosis not present

## 2023-11-04 DIAGNOSIS — I10 Essential (primary) hypertension: Secondary | ICD-10-CM | POA: Diagnosis not present

## 2023-11-04 DIAGNOSIS — E1165 Type 2 diabetes mellitus with hyperglycemia: Secondary | ICD-10-CM | POA: Diagnosis not present

## 2023-11-04 DIAGNOSIS — J4520 Mild intermittent asthma, uncomplicated: Secondary | ICD-10-CM

## 2023-11-04 DIAGNOSIS — I200 Unstable angina: Secondary | ICD-10-CM | POA: Diagnosis not present

## 2023-11-04 DIAGNOSIS — E785 Hyperlipidemia, unspecified: Secondary | ICD-10-CM

## 2023-11-04 DIAGNOSIS — R079 Chest pain, unspecified: Secondary | ICD-10-CM

## 2023-11-04 LAB — CBC
HCT: 40 % (ref 39.0–52.0)
Hemoglobin: 13.3 g/dL (ref 13.0–17.0)
MCH: 27 pg (ref 26.0–34.0)
MCHC: 33.3 g/dL (ref 30.0–36.0)
MCV: 81.1 fL (ref 80.0–100.0)
Platelets: 306 K/uL (ref 150–400)
RBC: 4.93 MIL/uL (ref 4.22–5.81)
RDW: 13.2 % (ref 11.5–15.5)
WBC: 7.6 K/uL (ref 4.0–10.5)
nRBC: 0 % (ref 0.0–0.2)

## 2023-11-04 LAB — APTT
aPTT: 77 s — ABNORMAL HIGH (ref 24–36)
aPTT: 80 s — ABNORMAL HIGH (ref 24–36)

## 2023-11-04 LAB — LIPID PANEL
Cholesterol: 324 mg/dL — ABNORMAL HIGH (ref 0–200)
HDL: 39 mg/dL — ABNORMAL LOW (ref >40)
LDL Cholesterol: UNDETERMINED mg/dL (ref 0–99)
Total CHOL/HDL Ratio: 8.3 ratio
Triglycerides: 717 mg/dL — ABNORMAL HIGH (ref <150)
VLDL: UNDETERMINED mg/dL (ref 0–40)

## 2023-11-04 LAB — COMPREHENSIVE METABOLIC PANEL WITH GFR
ALT: 31 U/L (ref 0–44)
AST: 20 U/L (ref 15–41)
Albumin: 3.6 g/dL (ref 3.5–5.0)
Alkaline Phosphatase: 93 U/L (ref 38–126)
Anion gap: 10 (ref 5–15)
BUN: 8 mg/dL (ref 6–20)
CO2: 26 mmol/L (ref 22–32)
Calcium: 9.1 mg/dL (ref 8.9–10.3)
Chloride: 98 mmol/L (ref 98–111)
Creatinine, Ser: 0.79 mg/dL (ref 0.61–1.24)
GFR, Estimated: >60 mL/min (ref >60)
Glucose, Bld: 306 mg/dL — ABNORMAL HIGH (ref 70–99)
Potassium: 3.5 mmol/L (ref 3.5–5.1)
Sodium: 134 mmol/L — ABNORMAL LOW (ref 135–145)
Total Bilirubin: 0.6 mg/dL (ref 0.0–1.2)
Total Protein: 7.4 g/dL (ref 6.5–8.1)

## 2023-11-04 LAB — GLUCOSE, CAPILLARY
Glucose-Capillary: 252 mg/dL — ABNORMAL HIGH (ref 70–99)
Glucose-Capillary: 358 mg/dL — ABNORMAL HIGH (ref 70–99)
Glucose-Capillary: 331 mg/dL — ABNORMAL HIGH (ref 70–99)
Glucose-Capillary: 320 mg/dL — ABNORMAL HIGH (ref 70–99)
Glucose-Capillary: 239 mg/dL — ABNORMAL HIGH (ref 70–99)

## 2023-11-04 LAB — ECHOCARDIOGRAM COMPLETE
AR max vel: 4.25 cm2
AV Peak grad: 4.5 mmHg
Ao pk vel: 1.06 m/s
Area-P 1/2: 3.77 cm2
Height: 71.5 in
S' Lateral: 2.1 cm
Weight: 3862.4 [oz_av]

## 2023-11-04 LAB — HEPARIN LEVEL (UNFRACTIONATED)
Heparin Unfractionated: 0.13 [IU]/mL — ABNORMAL LOW (ref 0.30–0.70)
Heparin Unfractionated: 0.31 [IU]/mL (ref 0.30–0.70)
Heparin Unfractionated: 0.36 [IU]/mL (ref 0.30–0.70)
Heparin Unfractionated: <0.1 [IU]/mL — ABNORMAL LOW (ref 0.30–0.70)

## 2023-11-04 LAB — HIV ANTIBODY (ROUTINE TESTING W REFLEX): HIV Screen 4th Generation wRfx: NONREACTIVE

## 2023-11-04 LAB — LDL CHOLESTEROL, DIRECT: Direct LDL: 195 mg/dL — ABNORMAL HIGH (ref 0–99)

## 2023-11-04 LAB — TROPONIN I (HIGH SENSITIVITY): Troponin I (High Sensitivity): 4 ng/L (ref <18)

## 2023-11-04 MED ORDER — INSULIN ASPART 100 UNIT/ML IJ SOLN
3.0000 [IU] | Freq: Three times a day (TID) | INTRAMUSCULAR | Status: DC
  Administered 2023-11-04: 3 [IU] via SUBCUTANEOUS

## 2023-11-04 MED ORDER — HEPARIN BOLUS VIA INFUSION
1500.0000 [IU] | Freq: Once | INTRAVENOUS | Status: AC
Start: 2023-11-04 — End: 2023-11-04
  Administered 2023-11-04: 1500 [IU] via INTRAVENOUS
  Filled 2023-11-04: qty 1500

## 2023-11-04 MED ORDER — ORAL CARE MOUTH RINSE: 15.0000 mL | OROMUCOSAL | Status: DC | PRN

## 2023-11-04 MED ORDER — INSULIN ASPART 100 UNIT/ML IJ SOLN: 5.0000 [IU] | Freq: Once | INTRAMUSCULAR | Status: DC

## 2023-11-04 MED ORDER — FREE WATER
500.0000 mL | Freq: Once | Status: AC
  Administered 2023-11-05: 500 mL via ORAL

## 2023-11-04 MED ORDER — LISINOPRIL 5 MG PO TABS
5.0000 mg | ORAL_TABLET | Freq: Every day | ORAL | Status: DC
  Administered 2023-11-04 – 2023-11-05 (×2): 5 mg via ORAL
  Filled 2023-11-04 (×2): qty 1

## 2023-11-04 MED ORDER — HEPARIN BOLUS VIA INFUSION
1500.0000 [IU] | Freq: Once | INTRAVENOUS | Status: AC
  Administered 2023-11-04: 1500 [IU] via INTRAVENOUS
  Filled 2023-11-04: qty 1500

## 2023-11-04 MED ORDER — INSULIN ASPART 100 UNIT/ML IJ SOLN
0.0000 [IU] | Freq: Every day | INTRAMUSCULAR | Status: DC
  Administered 2023-11-04: 2 [IU] via SUBCUTANEOUS

## 2023-11-04 MED ORDER — INSULIN GLARGINE 100 UNIT/ML ~~LOC~~ SOLN
20.0000 [IU] | Freq: Every day | SUBCUTANEOUS | Status: DC
  Administered 2023-11-04 – 2023-11-05 (×2): 20 [IU] via SUBCUTANEOUS
  Filled 2023-11-04 (×2): qty 0.2

## 2023-11-04 MED ORDER — LIVING WELL WITH DIABETES BOOK
Freq: Once | Status: DC
Start: 2023-11-04 — End: 2023-11-05
  Filled 2023-11-04: qty 1

## 2023-11-04 MED ORDER — ROSUVASTATIN CALCIUM 20 MG PO TABS
20.0000 mg | ORAL_TABLET | Freq: Every day | ORAL | Status: DC
  Administered 2023-11-04 – 2023-11-05 (×2): 20 mg via ORAL
  Filled 2023-11-04: qty 1
  Filled 2023-11-04: qty 4

## 2023-11-04 MED ORDER — INSULIN ASPART 100 UNIT/ML IJ SOLN
0.0000 [IU] | Freq: Three times a day (TID) | INTRAMUSCULAR | Status: DC
  Administered 2023-11-04: 8 [IU] via SUBCUTANEOUS
  Administered 2023-11-04: 11 [IU] via SUBCUTANEOUS
  Administered 2023-11-05: 5 [IU] via SUBCUTANEOUS
  Administered 2023-11-05: 11 [IU] via SUBCUTANEOUS

## 2023-11-04 NOTE — Progress Notes (Signed)
 PHARMACY - ANTICOAGULATION CONSULT NOTE  Pharmacy Consult for heparin  Indication: chest pain/ACS  No Known Allergies  Patient Measurements: Height: 5' 11.5 (181.6 cm) Weight: 109.5 kg (241 lb 6.4 oz) IBW/kg (Calculated) : 76.45 HEPARIN  DW (KG): 99.7  Vital Signs: Temp: 97.7 F (36.5 C) (09/14 1342) Temp Source: Oral (09/14 1342) BP: 121/82 (09/14 1342) Pulse Rate: 76 (09/14 0433)  Labs: Recent Labs    11/03/23 1138 11/03/23 2231 11/04/23 0009 11/04/23 0022 11/04/23 0227 11/04/23 0801 11/04/23 1554  HGB 13.2  --   --   --  13.3  --   --   HCT 39.5  --   --   --  40.0  --   --   PLT 288  --   --   --  306  --   --   APTT  --   --   --   --   --   --  77*  HEPARINUNFRC  --   --   --  <0.10*  --  0.13* 0.31  CREATININE 1.00  --   --   --  0.79  --   --   TROPONINIHS  --  3 4  --   --   --   --     Estimated Creatinine Clearance: 169.7 mL/min (by C-G formula based on SCr of 0.79 mg/dL).   Medical History: Past Medical History:  Diagnosis Date   Asthma    Diabetes mellitus without complication (HCC)    Mood disorder (HCC)      Assessment: 39 YOM presenting with chest tightness for months --> possible NSTEMI. He is not on anticoagulation PTA.  9/14 12:45AM: heparin  level undetectable on 1300 units/hr. Per RN, no issues with heparin  gtt running continuously or s/sx of bleeding.  9/14 0800 AM update: -Heparin  level subtherapeutic at 0.13 after a 1500 unit bolus and gtt started 0106 9/14 -Hgb, PLT WNL -No signs of bleeding or pauses in infusion noted.  PM update: aPTT 77, HL 0.31 on 1900 units/hr. No issues with the infusion or bleeding reported.  Goal of Therapy:  Heparin  level 0.3-0.7 units/ml Monitor platelets by anticoagulation protocol: Yes   Plan:  -Continue heparin  gtt at 1900 units/hr -F/u 6 hour aPTT as elevated triglycerides can falsely elevated HL -Monitor Hgb, PLT, signs of bleeding -F/u [facility] Monday 9/15  Rocky Slade, PharmD,  BCPS 11/04/2023 4:31 PM  Please check AMION for all [facility] Pharmacy phone numbers After 10:00 PM, call Main Pharmacy [phone]

## 2023-11-04 NOTE — Progress Notes (Signed)
 Pt cbg 358 checked after eating lunch. MD called to address coverage as pt had already eaten when cgb checked. Per MD SSI was given.  Dinner cbg 320 was checked approximately 2.5 hours after SSI given. Pt had already food brought from home. PM RN was made aware of issue. Requested pt to notify staff to check cbg prior to eating meals.

## 2023-11-04 NOTE — Plan of Care (Signed)
  Problem: Education: Goal: Ability to describe self-care measures that may prevent or decrease complications (Diabetes Survival Skills Education) will improve Outcome: Progressing   Problem: Coping: Goal: Ability to adjust to condition or change in health will improve Outcome: Progressing. Pt without chest pain and agreeable to heart cath.   Problem: Fluid Volume: Goal: Ability to maintain a balanced intake and output will improve Outcome: Progressing

## 2023-11-04 NOTE — Progress Notes (Signed)
 PHARMACY - ANTICOAGULATION CONSULT NOTE  Pharmacy Consult for heparin  Indication: chest pain/ACS  No Known Allergies  Patient Measurements: Height: 5' 11.5 (181.6 cm) Weight: 109.5 kg (241 lb 6.4 oz) IBW/kg (Calculated) : 76.45 HEPARIN  DW (KG): 99.7  Vital Signs: Temp: 97.3 F (36.3 C) (09/14 0433) Temp Source: Oral (09/14 0433) BP: 121/78 (09/14 0433) Pulse Rate: 76 (09/14 0433)  Labs: Recent Labs    11/03/23 1138 11/03/23 2231 11/04/23 0009 11/04/23 0022 11/04/23 0227  HGB 13.2  --   --   --  13.3  HCT 39.5  --   --   --  40.0  PLT 288  --   --   --  306  HEPARINUNFRC  --   --   --  <0.10*  --   CREATININE 1.00  --   --   --  0.79  TROPONINIHS  --  3 4  --   --     Estimated Creatinine Clearance: 169.7 mL/min (by C-G formula based on SCr of 0.79 mg/dL).   Medical History: Past Medical History:  Diagnosis Date   Asthma    Diabetes mellitus without complication (HCC)    Mood disorder (HCC)      Assessment: 12 YOM presenting with chest tightness for months --> possible NSTEMI. He is not on anticoagulation PTA.  9/14 12:45AM: heparin  level undetectable on 1300 units/hr. Per RN, no issues with heparin  gtt running continuously or s/sx of bleeding.  9/14 0800 AM update: -Heparin  level subtherapeutic at 0.13 after a 1500 unit bolus and gtt started 0106 9/14 -Hgb, PLT WNL -No signs of bleeding or pauses in infusion noted.  Goal of Therapy:  Heparin  level 0.3-0.7 units/ml Monitor platelets by anticoagulation protocol: Yes   Plan:  -Initiate Heparin  1500 units IV x 1, and increase gtt to 1900 units/hr -F/u 6 hour heparin  level -Monitor Hgb, PLT, signs of bleeding -F/u [facility] Monday 9/15  Izetta Carl, PharmD PGY1 Pharmacy Resident [facility]  11/04/2023 7:39 AM

## 2023-11-04 NOTE — Progress Notes (Signed)
 Rounding Note   Patient Name: Ricky [name] Date of Encounter: 11/04/2023  [facility] Cardiologist: None   Subjective BP 121/78. Denies any chest pain this morning. Cr 0.79, Hgb 13.3.  Scheduled Meds:  aspirin  EC  81 mg Oral Daily   fenofibrate   160 mg Oral Daily   insulin  aspart  0-15 Units Subcutaneous TID WC   insulin  aspart  0-5 Units Subcutaneous QHS   insulin  aspart  3 Units Subcutaneous TID WC   insulin  glargine  20 Units Subcutaneous Daily   lamoTRIgine   25 mg Oral Daily   lisinopril   5 mg Oral Daily   pantoprazole   40 mg Oral Daily   rosuvastatin   20 mg Oral Daily   Continuous Infusions:  heparin  1,600 Units/hr (11/04/23 0106)   PRN Meds: acetaminophen , hydrOXYzine , nitroGLYCERIN , ondansetron  (ZOFRAN ) IV, mouth rinse   Vital Signs  Vitals:   11/03/23 2015 11/03/23 2143 11/04/23 0012 11/04/23 0433  BP:  (!) 145/97 (!) 141/86 121/78  Pulse:   88 76  Resp:  16 16 13   Temp: 98.1 F (36.7 C) 97.8 F (36.6 C) 98.1 F (36.7 C) (!) 97.3 F (36.3 C)  TempSrc: Oral Oral Oral Oral  SpO2:  100% 100% 100%  Weight:  109.5 kg    Height:  5' 11.5 (1.816 m)      Intake/Output Summary (Last 24 hours) at 11/04/2023 0835 Last data filed at 11/04/2023 0400 Gross per 24 hour  Intake 192.03 ml  Output 300 ml  Net -107.97 ml      11/03/2023    9:43 PM 11/03/2023   11:28 AM 07/25/2023    9:29 AM  Last 3 Weights  Weight (lbs) 241 lb 6.4 oz 250 lb 247 lb 11.2 oz  Weight (kg) 109.498 kg 113.399 kg 112.356 kg      Telemetry NSR - Personally Reviewed  ECG  Sinus rhythm, rate 97, T wave inversions ST depressions in inferior leads on initial EKG, subsequent EKG today shows resolution of T wave inversions- Personally Reviewed  Physical Exam  GEN: No acute distress.   Neck: No JVD Cardiac: RRR, no murmurs, rubs, or gallops.  Respiratory: Clear to auscultation bilaterally. GI: Soft, nontender, non-distended  MS: No edema; No deformity. Neuro:  Nonfocal   Psych: Normal affect   Labs High Sensitivity Troponin:   Recent Labs  Lab 11/03/23 2231 11/04/23 0009  TROPONINIHS 3 4     Chemistry Recent Labs  Lab 11/03/23 1138 11/04/23 0227  NA 133* 134*  K 3.7 3.5  CL 95* 98  CO2 23 26  GLUCOSE 349* 306*  BUN 9 8  CREATININE 1.00 0.79  CALCIUM  9.5 9.1  PROT  --  7.4  ALBUMIN  --  3.6  AST  --  20  ALT  --  31  ALKPHOS  --  93  BILITOT  --  0.6  GFRNONAA >60 >60  ANIONGAP 15 10    Lipids  Recent Labs  Lab 11/04/23 0227  CHOL 324*  TRIG 717*  HDL 39*  LDLCALC UNABLE TO CALCULATE IF TRIGLYCERIDE OVER 400 mg/dL  CHOLHDL 8.3    Hematology Recent Labs  Lab 11/03/23 1138 11/04/23 0227  WBC 7.2 7.6  RBC 4.89 4.93  HGB 13.2 13.3  HCT 39.5 40.0  MCV 80.8 81.1  MCH 27.0 27.0  MCHC 33.4 33.3  RDW 13.0 13.2  PLT 288 306   Thyroid No results for input(s): TSH, FREET4 in the last 168 hours.  BNPNo results for input(s):  BNP, PROBNP in the last 168 hours.  DDimer  Recent Labs  Lab 11/03/23 1501  DDIMER <0.27     Radiology  CT Angio Chest/Abd/Pel for Dissection W and/or Wo Contrast Result Date: 11/03/2023 CLINICAL DATA:  Chest tightness, short of breath, lower back pain yesterday while at work, generalized fatigue EXAM: CT ANGIOGRAPHY CHEST, ABDOMEN AND PELVIS TECHNIQUE: Non-contrast CT of the chest was initially obtained. Multidetector CT imaging through the chest, abdomen and pelvis was performed using the standard protocol during bolus administration of intravenous contrast. Multiplanar reconstructed images and MIPs were obtained and reviewed to evaluate the vascular anatomy. RADIATION DOSE REDUCTION: This exam was performed according to the departmental dose-optimization program which includes automated exposure control, adjustment of the mA and/or kV according to patient size and/or use of iterative reconstruction technique. CONTRAST:  OMNIPAQUE  IOHEXOL  350 MG/ML SOLN COMPARISON:  11/03/2023 FINDINGS: CTA  CHEST FINDINGS Cardiovascular: The heart is unremarkable without pericardial effusion. No evidence of thoracic aortic aneurysm or dissection. There is technically adequate opacification of the pulmonary vasculature. No filling defects or pulmonary emboli. Mediastinum/Nodes: No enlarged mediastinal, hilar, or axillary lymph nodes. Thyroid gland, trachea, and esophagus demonstrate no significant findings. Lungs/Pleura: No acute airspace disease, effusion, or pneumothorax. Central airways are patent. Musculoskeletal: No acute or destructive bony abnormalities. Reconstructed images demonstrate no additional findings. Review of the MIP images confirms the above findings. CTA ABDOMEN AND PELVIS FINDINGS VASCULAR Aorta: Normal caliber aorta without aneurysm, dissection, vasculitis or significant stenosis. Celiac: Patent without evidence of aneurysm, dissection, vasculitis or significant stenosis. SMA: Patent without evidence of aneurysm, dissection, vasculitis or significant stenosis. Renals: Both renal arteries are patent without evidence of aneurysm, dissection, vasculitis, fibromuscular dysplasia or significant stenosis. IMA: Patent without evidence of aneurysm, dissection, vasculitis or significant stenosis. Inflow: Patent without evidence of aneurysm, dissection, vasculitis or significant stenosis. Veins: No obvious venous abnormality within the limitations of this arterial phase study. Review of the MIP images confirms the above findings. NON-VASCULAR Hepatobiliary: No focal liver abnormality is seen. No gallstones, gallbladder wall thickening, or biliary dilatation. Pancreas: Unremarkable. No pancreatic ductal dilatation or surrounding inflammatory changes. Spleen: Normal in size without focal abnormality. Adrenals/Urinary Tract: No urinary tract calculi or obstructive uropathy within either kidney. Kidneys enhance normally. The adrenals and bladder are unremarkable. Stomach/Bowel: No bowel obstruction or ileus.  Normal appendix right lower quadrant. No bowel wall thickening or inflammatory change. Lymphatic: No pathologic adenopathy. Reproductive: Prostate is unremarkable. Other: No free fluid or free intraperitoneal gas. No abdominal wall hernia. Musculoskeletal: No acute or destructive bony abnormalities. Reconstructed images demonstrate no additional findings. Review of the MIP images confirms the above findings. IMPRESSION: 1. No evidence of thoracoabdominal aortic aneurysm or dissection. 2. No evidence of pulmonary embolus. 3. No acute intrathoracic, intra-abdominal, or intrapelvic process. Electronically Signed   By: Ozell Daring M.D.   On: 11/03/2023 17:21   DG Chest 2 View Result Date: 11/03/2023 EXAM: 2 VIEW(S) XRAY OF THE CHEST 11/03/2023 11:48:00 AM COMPARISON: 1 view chest x-ray 03/09/2023. CLINICAL HISTORY: Chest tightness, shortness of breath, and lower back pain onset yesterday while at work. Symptoms have persisted since and now patient feels generalized fatigue. FINDINGS: LUNGS AND PLEURA: Clear lungs. No pneumothorax or pleural effusion. HEART AND MEDIASTINUM: No acute abnormality of the cardiac and mediastinal silhouettes. BONES AND SOFT TISSUES: No acute osseous abnormality. IMPRESSION: 1. No acute process. Electronically signed by: Lonni Necessary MD 11/03/2023 12:19 PM EDT RP Workstation: [id number]    Cardiac Studies   Patient Profile  33 y.o. male with a history of poorly controlled diabetes, hyperlipidemia who we are consulted for evaluation of chest pain.  Assessment & Plan  Unstable angina: Presented with chest tightness and shortness of breath that was waxing and waning throughout the day yesterday.  Resolved with nitroglycerin  in ED.  Troponins minimally elevated and flat.  Initial EKG with ST depression/T wave inversions in inferior leads, resolved on subsequent EKG.  Despite age, does have multiple risk factors for CAD with strong family history (reports mother had MI in  late 30s/early 68s), very poorly controlled diabetes (A1c 14.9 06/2023), hyperlipidemia (LDL 195) - Given dynamic EKG changes and patient with multiple risk factors and chest pain resolving with nitroglycerin , will treat for unstable angina - Cardiac catheterization recommended. Risks and benefits of cardiac catheterization have been discussed with the patient.  These include bleeding, infection, kidney damage, stroke, heart attack, death.  The patient understands these risks and is willing to proceed. - Continue aspirin , heparin  drip - As needed sublingual nitroglycerin  - Continue rosuvastatin  20 mg daily  Hypertension: On lisinopril  5 mg daily  Hyperlipidemia: LDL 195, triglycerides 717.  Was on fenofibrate  for hypertriglyceridemia as outpatient, added rosuvastatin  20 mg daily.  Would benefit from lipid clinic referral as outpatient  T2DM: Very poorly controlled, A1c 14.9 on 07/11/2023.  A1c 12.4 on this admission.  On insulin     For questions or updates, please contact [facility] Please consult [email or web address] for contact info under       Signed, Lonni LITTIE Nanas, MD  11/04/2023, 8:35 AM

## 2023-11-04 NOTE — Consult Note (Incomplete)
 Cardiology Consultation   Patient ID: Ricky [name] MRN: [id number]; DOB: [birth year]  Admit date: 11/03/2023 Date of Consult: 11/03/2023  PCP:  Ricky Torrence GRADE, MD   [facility] Providers Cardiologist:  None   { Click here to update MD or APP on Care Team, Refresh:1}     Patient Profile: Ricky [name] is a 33 y.o. male with a hx of diabetes and hyperlipdemia who is being seen 11/03/2023 for the evaluation of chest pain at the request of hospitalist service.  History of Present Illness: Ricky [name] presents with chest tightness and SOB that started yesterday and has been waxing/waning all day.  Currently pain free after receiving ASA, nitroglycerin , and heparin .  This has been going on for months.  He has occasionally presented to the ED for this at [facility] but he reports that usually he is discharged after normal labs.  In ED yesterday:  Chest CTA without PE or dissection Troponins 22 to 21 to 20 ECG shows Q waves V1-3, TWI in inferolateral leads.  Of note, blood sugars quite elevated, also last triglyceride measured >1000, A1c of 15 He is on a fenofibrate  but had stopped for a while, resumed a few weeks ago.  Also reports that he often forgets to take his insulin .  No active tobacco currently. No prior cardiac history, but Family Hx of premature CAD Started on heparin  per daytime cardiology recommendations.   Past Medical History:  Diagnosis Date  . Asthma   . Diabetes mellitus without complication (HCC)   . Mood disorder [facility])     Past Surgical History:  Procedure Laterality Date  . WISDOM TOOTH EXTRACTION       {Home Medications (Optional):21181}  Scheduled Meds: . [START ON 11/04/2023] aspirin  EC  81 mg Oral Daily  . [START ON 11/04/2023] atorvastatin   20 mg Oral Daily  . [START ON 11/04/2023] empagliflozin   25 mg Oral Daily  . [START ON 11/04/2023] fenofibrate   160 mg Oral Daily  . [START ON 11/04/2023] insulin  aspart  0-15 Units  Subcutaneous TID WC  . insulin  aspart  0-5 Units Subcutaneous QHS  . lamoTRIgine   25 mg Oral Daily  . pantoprazole   40 mg Oral Daily   Continuous Infusions: . heparin  1,300 Units/hr (11/03/23 1800)   PRN Meds: acetaminophen , hydrOXYzine , nitroGLYCERIN , ondansetron  (ZOFRAN ) IV  Allergies:   No Known Allergies  Social History:   Social History   Socioeconomic History  . Marital status: Single    Spouse name: Not on file  . Number of children: 0  . Years of education: Not on file  . Highest education level: Not on file  Occupational History  . Not on file  Tobacco Use  . Smoking status: Never    Passive exposure: Never  . Smokeless tobacco: Never  Vaping Use  . Vaping status: Some Days  Substance and Sexual Activity  . Alcohol use: Yes    Comment: occ  . Drug use: No  . Sexual activity: Not Currently  Other Topics Concern  . Not on file  Social History Narrative  . Not on file   Social Drivers of Health   Financial Resource Strain: Low Risk  (03/28/2022)   Received from [facility]   Overall Financial Resource Strain (CARDIA)   . Difficulty of Paying Living Expenses: Not hard at all  Food Insecurity: No Food Insecurity (03/28/2022)   Received from [facility]   Hunger Vital Sign   . Within the past 12 months, you worried that your  food would run out before you got the money to buy more.: Never true   . Within the past 12 months, the food you bought just didn't last and you didn't have money to get more.: Never true  Transportation Needs: No Transportation Needs (03/28/2022)   Received from [facility] - Transportation   . Lack of Transportation (Medical): No   . Lack of Transportation (Non-Medical): No  Physical Activity: Sufficiently Active (03/28/2022)   Received from [facility]   Exercise Vital Sign   . On average, how many days per week do you engage in moderate to strenuous exercise (like a brisk walk)?: 5 days   . On average, how many minutes  do you engage in exercise at this level?: 30 min  Stress: Stress Concern Present (03/28/2022)   Received from [facility] - Occupational Stress Questionnaire   . Feeling of Stress : To some extent  Social Connections: Socially Integrated (03/28/2022)   Received from [facility]   Social Network   . How would you rate your social network (family, work, friends)?: Good participation with social networks  Intimate Partner Violence: Not At Risk (03/28/2022)   Received from [facility]   HITS   . Over the last 12 months how often did your partner physically hurt you?: Never   . Over the last 12 months how often did your partner insult you or talk down to you?: Never   . Over the last 12 months how often did your partner threaten you with physical harm?: Never   . Over the last 12 months how often did your partner scream or curse at you?: Never    Family History:   \ Family History  Problem Relation Age of Onset  . Bipolar disorder Mother   . CAD Mother   . CAD Father   . Schizophrenia Maternal Grandmother   . CAD Other      ROS:  Please see the history of present illness.   All other ROS reviewed and negative.     Physical Exam/Data: Vitals:   11/03/23 1900 11/03/23 1930 11/03/23 2015 11/03/23 2143  BP: (!) 142/92 (!) 139/91  (!) 145/97  Pulse: 92 91    Resp: 10 (!) 22  16  Temp:   98.1 F (36.7 C) 97.8 F (36.6 C)  TempSrc:   Oral Oral  SpO2: 100% 100%  100%  Weight:    109.5 kg  Height:    5' 11.5 (1.816 m)    Intake/Output Summary (Last 24 hours) at 11/03/2023 2336 Last data filed at 11/03/2023 2258 Gross per 24 hour  Intake --  Output 300 ml  Net -300 ml      11/03/2023    9:43 PM 11/03/2023   11:28 AM 07/25/2023    9:29 AM  Last 3 Weights  Weight (lbs) 241 lb 6.4 oz 250 lb 247 lb 11.2 oz  Weight (kg) 109.498 kg 113.399 kg 112.356 kg     Body mass index is 33.2 kg/m.  General:  Well nourished, well developed, in no  acute distress HEENT: normal Neck: no JVD Vascular: No carotid bruits; Distal pulses 2+ bilaterally Cardiac:  normal S1, S2; RRR; no murmur  Lungs:  clear to auscultation bilaterally, no wheezing, rhonchi or rales  Abd: soft, nontender, no hepatomegaly  Ext: no edema Skin: warm and dry  Neuro:  CNs 2-12 intact, no focal abnormalities noted Psych:  Normal affect  Laboratory Data: High Sensitivity Troponin:   Recent Labs  Lab 11/03/23 2231  TROPONINIHS 3     Chemistry Recent Labs  Lab 11/03/23 1138  NA 133*  K 3.7  CL 95*  CO2 23  GLUCOSE 349*  BUN 9  CREATININE 1.00  CALCIUM  9.5  GFRNONAA >60  ANIONGAP 15    No results for input(s): PROT, ALBUMIN, AST, ALT, ALKPHOS, BILITOT in the last 168 hours. Lipids No results for input(s): CHOL, TRIG, HDL, LABVLDL, LDLCALC, CHOLHDL in the last 168 hours.  Hematology Recent Labs  Lab 11/03/23 1138  WBC 7.2  RBC 4.89  HGB 13.2  HCT 39.5  MCV 80.8  MCH 27.0  MCHC 33.4  RDW 13.0  PLT 288   Thyroid No results for input(s): TSH, FREET4 in the last 168 hours.  BNPNo results for input(s): BNP, PROBNP in the last 168 hours.  DDimer  Recent Labs  Lab 11/03/23 1501  DDIMER <0.27    Radiology/Studies:  CT Angio Chest/Abd/Pel for Dissection W and/or Wo Contrast Result Date: 11/03/2023 CLINICAL DATA:  Chest tightness, short of breath, lower back pain yesterday while at work, generalized fatigue EXAM: CT ANGIOGRAPHY CHEST, ABDOMEN AND PELVIS TECHNIQUE: Non-contrast CT of the chest was initially obtained. Multidetector CT imaging through the chest, abdomen and pelvis was performed using the standard protocol during bolus administration of intravenous contrast. Multiplanar reconstructed images and MIPs were obtained and reviewed to evaluate the vascular anatomy. RADIATION DOSE REDUCTION: This exam was performed according to the departmental dose-optimization program which includes automated exposure  control, adjustment of the mA and/or kV according to patient size and/or use of iterative reconstruction technique. CONTRAST:  OMNIPAQUE  IOHEXOL  350 MG/ML SOLN COMPARISON:  11/03/2023 FINDINGS: CTA CHEST FINDINGS Cardiovascular: The heart is unremarkable without pericardial effusion. No evidence of thoracic aortic aneurysm or dissection. There is technically adequate opacification of the pulmonary vasculature. No filling defects or pulmonary emboli. Mediastinum/Nodes: No enlarged mediastinal, hilar, or axillary lymph nodes. Thyroid gland, trachea, and esophagus demonstrate no significant findings. Lungs/Pleura: No acute airspace disease, effusion, or pneumothorax. Central airways are patent. Musculoskeletal: No acute or destructive bony abnormalities. Reconstructed images demonstrate no additional findings. Review of the MIP images confirms the above findings. CTA ABDOMEN AND PELVIS FINDINGS VASCULAR Aorta: Normal caliber aorta without aneurysm, dissection, vasculitis or significant stenosis. Celiac: Patent without evidence of aneurysm, dissection, vasculitis or significant stenosis. SMA: Patent without evidence of aneurysm, dissection, vasculitis or significant stenosis. Renals: Both renal arteries are patent without evidence of aneurysm, dissection, vasculitis, fibromuscular dysplasia or significant stenosis. IMA: Patent without evidence of aneurysm, dissection, vasculitis or significant stenosis. Inflow: Patent without evidence of aneurysm, dissection, vasculitis or significant stenosis. Veins: No obvious venous abnormality within the limitations of this arterial phase study. Review of the MIP images confirms the above findings. NON-VASCULAR Hepatobiliary: No focal liver abnormality is seen. No gallstones, gallbladder wall thickening, or biliary dilatation. Pancreas: Unremarkable. No pancreatic ductal dilatation or surrounding inflammatory changes. Spleen: Normal in size without focal abnormality.  Adrenals/Urinary Tract: No urinary tract calculi or obstructive uropathy within either kidney. Kidneys enhance normally. The adrenals and bladder are unremarkable. Stomach/Bowel: No bowel obstruction or ileus. Normal appendix right lower quadrant. No bowel wall thickening or inflammatory change. Lymphatic: No pathologic adenopathy. Reproductive: Prostate is unremarkable. Other: No free fluid or free intraperitoneal gas. No abdominal wall hernia. Musculoskeletal: No acute or destructive bony abnormalities. Reconstructed images demonstrate no additional findings. Review of the MIP images confirms the above findings. IMPRESSION: 1. No  evidence of thoracoabdominal aortic aneurysm or dissection. 2. No evidence of pulmonary embolus. 3. No acute intrathoracic, intra-abdominal, or intrapelvic process. Electronically Signed   By: Ozell Daring M.D.   On: 11/03/2023 17:21   DG Chest 2 View Result Date: 11/03/2023 EXAM: 2 VIEW(S) XRAY OF THE CHEST 11/03/2023 11:48:00 AM COMPARISON: 1 view chest x-ray 03/09/2023. CLINICAL HISTORY: Chest tightness, shortness of breath, and lower back pain onset yesterday while at work. Symptoms have persisted since and now patient feels generalized fatigue. FINDINGS: LUNGS AND PLEURA: Clear lungs. No pneumothorax or pleural effusion. HEART AND MEDIASTINUM: No acute abnormality of the cardiac and mediastinal silhouettes. BONES AND SOFT TISSUES: No acute osseous abnormality. IMPRESSION: 1. No acute process. Electronically signed by: Lonni Necessary MD 11/03/2023 12:19 PM EDT RP Workstation: [id number]     Assessment and Plan: Unstable angina - agree with heparin  for ACS indication - likely will undergo cath on Monday (please make NPO Sunday night) - hold metformin  for now. - ASA, high intensity statin - currently has enough bp room to start lisinopril .   Risk Assessment/Risk Scores: {Complete the following score calculators/questions to meet required metrics.  Press F2          :[id number]}   TIMI Risk Score for Unstable Angina or Non-ST Elevation MI:   The patient's TIMI risk score is 2, which indicates a 8% risk of all cause mortality, new or recurrent myocardial infarction or need for urgent revascularization in the next 14 days.{ Click here to calculate score  REFRESH Note before signing  :1}         For questions or updates, please contact [facility] Please consult [email or web address] for contact info under    {TIP  Split Shared Billing  Do NOT delete any part of this including brackets If split shared billing is based upon MDM, disregard If billing will be based upon TIME you MUST document the number of minutes and a detailed list of what was done in that time in the following format Example - I spent ** minutes seeing this patient. During that time I reviewed their history, evaluated their symptoms, reviewed available labs, EKGs, studies, performed an exam and formulated an assessment and plan   :1} {Select this only if you need to document critical care time (Optional):[phone]} Signed, Andee Flatten, MD  11/03/2023 11:36 PM

## 2023-11-04 NOTE — Progress Notes (Signed)
 PHARMACY - ANTICOAGULATION CONSULT NOTE  Pharmacy Consult for heparin  Indication: chest pain/ACS  No Known Allergies  Patient Measurements: Height: 5' 11.5 (181.6 cm) Weight: 109.5 kg (241 lb 6.4 oz) IBW/kg (Calculated) : 76.45 HEPARIN  DW (KG): 99.7  Vital Signs: Temp: 98.1 F (36.7 C) (09/14 0012) Temp Source: Oral (09/14 0012) BP: 141/86 (09/14 0012) Pulse Rate: 88 (09/14 0012)  Labs: Recent Labs    11/03/23 1138 11/03/23 2231 11/04/23 0022  HGB 13.2  --   --   HCT 39.5  --   --   PLT 288  --   --   HEPARINUNFRC  --   --  <0.10*  CREATININE 1.00  --   --   TROPONINIHS  --  3  --     Estimated Creatinine Clearance: 135.8 mL/min (by C-G formula based on SCr of 1 mg/dL).   Medical History: Past Medical History:  Diagnosis Date   Asthma    Diabetes mellitus without complication (HCC)    Mood disorder (HCC)      Assessment: 61 YOM presenting with chest tightness, he is not on anticoagulation PTA< CBC wnl.  AM: heparin  level undetectable on 1300 units/hr. Per RN, no issues with heparin  gtt running continuously or s/sx of bleeding.  Goal of Therapy:  Heparin  level 0.3-0.7 units/ml Monitor platelets by anticoagulation protocol: Yes   Plan:  Heparin  1500 units IV x 1, and gtt at 1600 units/hr F/u 6 hour heparin  level F/u [facility] Monday 9/15  Lynwood Poplar, PharmD, BCPS Clinical Pharmacist 11/04/2023 12:46 AM

## 2023-11-04 NOTE — Inpatient Diabetes Management (Signed)
 Inpatient Diabetes Program Recommendations  AACE/ADA: New Consensus Statement on Inpatient Glycemic Control (2015)  Target Ranges:  Prepandial:   less than 140 mg/dL      Peak postprandial:   less than 180 mg/dL (1-2 hours)      Critically ill patients:  140 - 180 mg/dL   Lab Results  Component Value Date   GLUCAP 252 (H) 11/04/2023   HGBA1C 12.4 (H) 11/03/2023    Latest Reference Range & Units 07/11/23 10:51 11/03/23 11:38  Hemoglobin A1C 4.8 - 5.6 % 14.9 (H) 12.4 (H)  (H): Data is abnormally high  Diabetes history: DM2 Outpatient Diabetes medications:  Lantus  40 units daily Metformin  1 gm bid Jardiance  25 mg daily Current orders for Inpatient glycemic control:  Lantus  20 units daily Novolog  3 units tid meal coverage Novolog  0-15 units tid, 0-5 units hs  Inpatient Diabetes Program Recommendations:   Noted patient's A1c has decreased from 14.9 on 07/11/23 to currently 12.4. Last endocrinology office visit with [facility] Delon Helper NP was 01/10/2023. Spoke with patient via phone (DM coordinator on weekend call working remotely). Patient thought he had an endocrinology visit in March but wasn't in care everywhere. Requested patient to call office and set up appt. And take his CGM readings with him. Reviewed normal levels of CBGs fasting and postprandial. A1c 12.4 (average blood glucose 309 over the past 2-3 months).Patient works @ [employer] and eats there. Patient states willingness to make nutrition changes including changing to sugar free drinks and decrease carbs. Patient plans to bring salads and get grilled chicken with his salad as an example.  Will follow during hospitalization. Ordered LWWD.  Thank you, Felicha Frayne E. Tashay Bozich, RN, MSN, CNS, CDCES  Diabetes Coordinator Inpatient Glycemic Control Team Team Pager [phone] (8am-5pm) 11/04/2023 10:06 AM

## 2023-11-04 NOTE — Progress Notes (Signed)
 [facility]                                                                              Ricky [name], is a 33 y.o. male, DOB - 28-Oct-1990, FMW:[id number] Admit date - 11/03/2023    Outpatient Primary MD for the patient is Colette Torrence GRADE, MD  LOS - 1  days  Chief Complaint  Patient presents with   Chest Pain   Back Pain       Brief summary   Patient is a 33 year old male with asthma, IDDM, hyperlipidemia, mood disorder presented with chest pain, intermittent episodes associated with dyspnea for several months.  Symptoms are not necessarily associated with exertion.  He works at a [employer] and a day before the admission at work he started having chest tightness again and lasted all day prompting him to go to ED.  Patient reports strong family history of CAD with both parents, maternal/paternal grandparents.  Troponin 19> 22> 21> 20, D-dimer negative.  CTA chest/abdomen/pelvis negative for PE, aortic aneurysm, or dissection.  EKG showing sinus rhythm and new T wave inversions in inferior leads compared to previous EKG from April 2023.  Cardiology consulted  Assessment & Plan      Chest pain - In the setting of IDDM, hyperlipidemia, obesity, strong family history of CAD, EKG changes, + troponins -Cardiology consulted, continue IV heparin  drip, sublingual nitro, rosuvastatin  - N.p.o. after midnight, plan for cardiac cath in a.m. - Follow 2D echo   Asthma -Stable, no wheezing - Does not use inhalers at home.   Uncontrolled insulin -dependent type 2 diabetes with hyperglycemia -Hemoglobin A1c 12.4 -Placed on Lantus  20 units daily, NovoLog  3 units 3 times daily AC, moderate sliding scale insulin  - Diabetic coordinator consult    Hypertriglyceridemia -Lipid panel showed cholesterol 324, HDL 39, LDL 195, triglycerides 717 -Continue rosuvastatin , fenofibrate  - Will benefit from lipid clinic referral as outpatient    Mood disorder Continue Atarax  as  needed, lamotrigine  25 mg daily  Obesity class I Estimated body mass index is 33.2 kg/m as calculated from the following:   Height as of this encounter: 5' 11.5 (1.816 m).   Weight as of this encounter: 109.5 kg.  Code Status: Full code DVT Prophylaxis:  IV heparin  drip   Level of Care: Level of care: Progressive Family Communication: Updated patient Disposition Plan:      Remains inpatient appropriate: Cardiac cath in a.m.   Procedures:    Consultants:   Cardiology  Antimicrobials:   Anti-infectives (From admission, onward)    None          Medications  aspirin  EC  81 mg Oral Daily   fenofibrate   160 mg Oral Daily   insulin  aspart  0-15 Units Subcutaneous TID WC   insulin  aspart  0-5 Units Subcutaneous QHS   insulin  aspart  3 Units Subcutaneous TID WC   insulin  glargine  20 Units Subcutaneous Daily   lamoTRIgine   25 mg Oral Daily   lisinopril   5 mg Oral Daily   pantoprazole   40 mg Oral Daily   rosuvastatin   20 mg Oral Daily      Subjective:  Ricky [name] was seen and examined today.  Improving, currently no chest pain, feeling better.  Patient denies dizziness, shortness of breath, abdominal pain, N/V. No acute events overnight.    Objective:   Vitals:   11/03/23 2143 11/04/23 0012 11/04/23 0433 11/04/23 0900  BP: (!) 145/97 (!) 141/86 121/78 119/77  Pulse:  88 76   Resp: 16 16 13 16   Temp: 97.8 F (36.6 C) 98.1 F (36.7 C) (!) 97.3 F (36.3 C) 98.7 F (37.1 C)  TempSrc: Oral Oral Oral Oral  SpO2: 100% 100% 100% 100%  Weight: 109.5 kg     Height: 5' 11.5 (1.816 m)       Intake/Output Summary (Last 24 hours) at 11/04/2023 0950 Last data filed at 11/04/2023 0400 Gross per 24 hour  Intake 192.03 ml  Output 300 ml  Net -107.97 ml     Wt Readings from Last 3 Encounters:  11/03/23 109.5 kg  07/25/23 112.4 kg  07/11/23 112.1 kg     Exam General: Alert and oriented x 3, NAD Cardiovascular: S1 S2 auscultated,  RRR Respiratory:  CTAB Gastrointestinal: Soft, nontender, nondistended, + bowel sounds Ext: no pedal edema bilaterally Neuro: No new deficits Psych: Normal affect     Data Reviewed:  I have personally reviewed following labs    CBC Lab Results  Component Value Date   WBC 7.6 11/04/2023   RBC 4.93 11/04/2023   HGB 13.3 11/04/2023   HCT 40.0 11/04/2023   MCV 81.1 11/04/2023   MCH 27.0 11/04/2023   PLT 306 11/04/2023   MCHC 33.3 11/04/2023   RDW 13.2 11/04/2023   LYMPHSABS 3.6 05/30/2021   MONOABS 0.5 05/30/2021   EOSABS 0.1 05/30/2021   BASOSABS 0.0 05/30/2021     Last metabolic panel Lab Results  Component Value Date   NA 134 (L) 11/04/2023   K 3.5 11/04/2023   CL 98 11/04/2023   CO2 26 11/04/2023   BUN 8 11/04/2023   CREATININE 0.79 11/04/2023   GLUCOSE 306 (H) 11/04/2023   GFRNONAA >60 11/04/2023   CALCIUM  9.1 11/04/2023   PROT 7.4 11/04/2023   ALBUMIN 3.6 11/04/2023   LABGLOB 3.0 07/11/2023   BILITOT 0.6 11/04/2023   ALKPHOS 93 11/04/2023   AST 20 11/04/2023   ALT 31 11/04/2023   ANIONGAP 10 11/04/2023    CBG (last 3)  Recent Labs    11/03/23 2146 11/03/23 2348 11/04/23 0627  GLUCAP 244* 354* 252*      Coagulation Profile: No results for input(s): INR, PROTIME in the last 168 hours.   Radiology Studies: I have personally reviewed the imaging studies  CT Angio Chest/Abd/Pel for Dissection W and/or Wo Contrast Result Date: 11/03/2023 CLINICAL DATA:  Chest tightness, short of breath, lower back pain yesterday while at work, generalized fatigue EXAM: CT ANGIOGRAPHY CHEST, ABDOMEN AND PELVIS TECHNIQUE: Non-contrast CT of the chest was initially obtained. Multidetector CT imaging through the chest, abdomen and pelvis was performed using the standard protocol during bolus administration of intravenous contrast. Multiplanar reconstructed images and MIPs were obtained and reviewed to evaluate the vascular anatomy. RADIATION DOSE REDUCTION: This exam was performed  according to the departmental dose-optimization program which includes automated exposure control, adjustment of the mA and/or kV according to patient size and/or use of iterative reconstruction technique. CONTRAST:  OMNIPAQUE  IOHEXOL  350 MG/ML SOLN COMPARISON:  11/03/2023 FINDINGS: CTA CHEST FINDINGS Cardiovascular: The heart is unremarkable without pericardial effusion. No evidence of thoracic aortic aneurysm or dissection. There is technically adequate opacification  of the pulmonary vasculature. No filling defects or pulmonary emboli. Mediastinum/Nodes: No enlarged mediastinal, hilar, or axillary lymph nodes. Thyroid gland, trachea, and esophagus demonstrate no significant findings. Lungs/Pleura: No acute airspace disease, effusion, or pneumothorax. Central airways are patent. Musculoskeletal: No acute or destructive bony abnormalities. Reconstructed images demonstrate no additional findings. Review of the MIP images confirms the above findings. CTA ABDOMEN AND PELVIS FINDINGS VASCULAR Aorta: Normal caliber aorta without aneurysm, dissection, vasculitis or significant stenosis. Celiac: Patent without evidence of aneurysm, dissection, vasculitis or significant stenosis. SMA: Patent without evidence of aneurysm, dissection, vasculitis or significant stenosis. Renals: Both renal arteries are patent without evidence of aneurysm, dissection, vasculitis, fibromuscular dysplasia or significant stenosis. IMA: Patent without evidence of aneurysm, dissection, vasculitis or significant stenosis. Inflow: Patent without evidence of aneurysm, dissection, vasculitis or significant stenosis. Veins: No obvious venous abnormality within the limitations of this arterial phase study. Review of the MIP images confirms the above findings. NON-VASCULAR Hepatobiliary: No focal liver abnormality is seen. No gallstones, gallbladder wall thickening, or biliary dilatation. Pancreas: Unremarkable. No pancreatic ductal dilatation or  surrounding inflammatory changes. Spleen: Normal in size without focal abnormality. Adrenals/Urinary Tract: No urinary tract calculi or obstructive uropathy within either kidney. Kidneys enhance normally. The adrenals and bladder are unremarkable. Stomach/Bowel: No bowel obstruction or ileus. Normal appendix right lower quadrant. No bowel wall thickening or inflammatory change. Lymphatic: No pathologic adenopathy. Reproductive: Prostate is unremarkable. Other: No free fluid or free intraperitoneal gas. No abdominal wall hernia. Musculoskeletal: No acute or destructive bony abnormalities. Reconstructed images demonstrate no additional findings. Review of the MIP images confirms the above findings. IMPRESSION: 1. No evidence of thoracoabdominal aortic aneurysm or dissection. 2. No evidence of pulmonary embolus. 3. No acute intrathoracic, intra-abdominal, or intrapelvic process. Electronically Signed   By: Ozell Daring M.D.   On: 11/03/2023 17:21   DG Chest 2 View Result Date: 11/03/2023 EXAM: 2 VIEW(S) XRAY OF THE CHEST 11/03/2023 11:48:00 AM COMPARISON: 1 view chest x-ray 03/09/2023. CLINICAL HISTORY: Chest tightness, shortness of breath, and lower back pain onset yesterday while at work. Symptoms have persisted since and now patient feels generalized fatigue. FINDINGS: LUNGS AND PLEURA: Clear lungs. No pneumothorax or pleural effusion. HEART AND MEDIASTINUM: No acute abnormality of the cardiac and mediastinal silhouettes. BONES AND SOFT TISSUES: No acute osseous abnormality. IMPRESSION: 1. No acute process. Electronically signed by: Lonni Necessary MD 11/03/2023 12:19 PM EDT RP Workstation: [id number]       Nydia Distance M.D. [facility] 11/04/2023, 9:50 AM  Available via Epic secure chat 7am-7pm After 7 pm, please refer to night coverage provider listed on amion.

## 2023-11-04 NOTE — Progress Notes (Signed)
 PHARMACY - ANTICOAGULATION CONSULT NOTE  Pharmacy Consult for heparin  Indication: USAP  Labs: Recent Labs    11/03/23 1138 11/03/23 2231 11/04/23 0009 11/04/23 0022 11/04/23 0227 11/04/23 0801 11/04/23 1554 11/04/23 2224  HGB 13.2  --   --   --  13.3  --   --   --   HCT 39.5  --   --   --  40.0  --   --   --   PLT 288  --   --   --  306  --   --   --   APTT  --   --   --   --   --   --  77* 80*  HEPARINUNFRC  --   --   --    < >  --  0.13* 0.31 0.36  CREATININE 1.00  --   --   --  0.79  --   --   --   TROPONINIHS  --  3 4  --   --   --   --   --    < > = values in this interval not displayed.   Assessment/Plan:  33yo male remains therapeutic on heparin . Will continue infusion at current rate of 1900 units/hr and monitor daily level.  Marvetta Dauphin, PharmD, BCPS 11/04/2023 11:04 PM

## 2023-11-04 NOTE — Progress Notes (Signed)
 Echocardiogram 2D Echocardiogram has been performed.  Ricky [name] 11/04/2023, 12:11 PM

## 2023-11-05 ENCOUNTER — Encounter (HOSPITAL_COMMUNITY): Payer: Self-pay | Admitting: Cardiology

## 2023-11-05 ENCOUNTER — Encounter (HOSPITAL_COMMUNITY)
Admission: EM | Disposition: A | Discharge status: Home / Self Care | Payer: Self-pay | Source: Home / Self Care | Attending: Internal Medicine

## 2023-11-05 DIAGNOSIS — R079 Chest pain, unspecified: Secondary | ICD-10-CM | POA: Diagnosis not present

## 2023-11-05 DIAGNOSIS — E1165 Type 2 diabetes mellitus with hyperglycemia: Secondary | ICD-10-CM | POA: Diagnosis not present

## 2023-11-05 DIAGNOSIS — R9431 Abnormal electrocardiogram [ECG] [EKG]: Secondary | ICD-10-CM | POA: Diagnosis not present

## 2023-11-05 DIAGNOSIS — M545 Low back pain, unspecified: Secondary | ICD-10-CM

## 2023-11-05 DIAGNOSIS — I25110 Atherosclerotic heart disease of native coronary artery with unstable angina pectoris: Secondary | ICD-10-CM | POA: Diagnosis not present

## 2023-11-05 DIAGNOSIS — Z794 Long term (current) use of insulin: Secondary | ICD-10-CM | POA: Diagnosis not present

## 2023-11-05 DIAGNOSIS — I200 Unstable angina: Secondary | ICD-10-CM | POA: Diagnosis not present

## 2023-11-05 DIAGNOSIS — E7800 Pure hypercholesterolemia, unspecified: Secondary | ICD-10-CM

## 2023-11-05 DIAGNOSIS — I10 Essential (primary) hypertension: Secondary | ICD-10-CM | POA: Diagnosis not present

## 2023-11-05 HISTORY — PX: LEFT HEART CATH AND CORONARY ANGIOGRAPHY: CATH118249

## 2023-11-05 LAB — CBC
HCT: 40 % (ref 39.0–52.0)
Hemoglobin: 13 g/dL (ref 13.0–17.0)
MCH: 26.6 pg (ref 26.0–34.0)
MCHC: 32.5 g/dL (ref 30.0–36.0)
MCV: 81.8 fL (ref 80.0–100.0)
Platelets: 269 K/uL (ref 150–400)
RBC: 4.89 MIL/uL (ref 4.22–5.81)
RDW: 13.2 % (ref 11.5–15.5)
WBC: 6.7 K/uL (ref 4.0–10.5)
nRBC: 0 % (ref 0.0–0.2)

## 2023-11-05 LAB — RENAL FUNCTION PANEL
Albumin: 3.3 g/dL — ABNORMAL LOW (ref 3.5–5.0)
Anion gap: 9 (ref 5–15)
BUN: 13 mg/dL (ref 6–20)
CO2: 24 mmol/L (ref 22–32)
Calcium: 9.1 mg/dL (ref 8.9–10.3)
Chloride: 101 mmol/L (ref 98–111)
Creatinine, Ser: 0.97 mg/dL (ref 0.61–1.24)
GFR, Estimated: >60 mL/min (ref >60)
Glucose, Bld: 283 mg/dL — ABNORMAL HIGH (ref 70–99)
Phosphorus: 4.3 mg/dL (ref 2.5–4.6)
Potassium: 3.4 mmol/L — ABNORMAL LOW (ref 3.5–5.1)
Sodium: 134 mmol/L — ABNORMAL LOW (ref 135–145)

## 2023-11-05 LAB — GLUCOSE, CAPILLARY
Glucose-Capillary: 329 mg/dL — ABNORMAL HIGH (ref 70–99)
Glucose-Capillary: 226 mg/dL — ABNORMAL HIGH (ref 70–99)
Glucose-Capillary: 237 mg/dL — ABNORMAL HIGH (ref 70–99)

## 2023-11-05 SURGERY — LEFT HEART CATH AND CORONARY ANGIOGRAPHY
Anesthesia: LOCAL

## 2023-11-05 MED ORDER — HEPARIN SODIUM (PORCINE) 1000 UNIT/ML IJ SOLN
INTRAMUSCULAR | Status: AC
  Filled 2023-11-05: qty 10

## 2023-11-05 MED ORDER — SODIUM CHLORIDE 0.9% FLUSH: 3.0000 mL | Freq: Two times a day (BID) | INTRAVENOUS | Status: DC

## 2023-11-05 MED ORDER — PANTOPRAZOLE SODIUM 40 MG PO TBEC: 40.0000 mg | DELAYED_RELEASE_TABLET | Freq: Every day | ORAL | 3 refills | Status: DC

## 2023-11-05 MED ORDER — LIDOCAINE HCL (PF) 1 % IJ SOLN
INTRAMUSCULAR | Status: AC
  Filled 2023-11-05: qty 30

## 2023-11-05 MED ORDER — INSULIN ASPART 100 UNIT/ML IJ SOLN: 6.0000 [IU] | Freq: Three times a day (TID) | INTRAMUSCULAR | Status: DC

## 2023-11-05 MED ORDER — FENTANYL CITRATE (PF) 100 MCG/2ML IJ SOLN
INTRAMUSCULAR | Status: DC | PRN
  Administered 2023-11-05: 25 ug via INTRAVENOUS

## 2023-11-05 MED ORDER — SODIUM CHLORIDE 0.9% FLUSH: 3.0000 mL | INTRAVENOUS | Status: DC | PRN

## 2023-11-05 MED ORDER — POTASSIUM CHLORIDE CRYS ER 20 MEQ PO TBCR
40.0000 meq | EXTENDED_RELEASE_TABLET | Freq: Once | ORAL | Status: AC
  Administered 2023-11-05: 40 meq via ORAL
  Filled 2023-11-05: qty 2

## 2023-11-05 MED ORDER — FENOFIBRATE 160 MG PO TABS: 160.0000 mg | ORAL_TABLET | Freq: Every day | ORAL | 3 refills | Status: DC

## 2023-11-05 MED ORDER — MIDAZOLAM HCL 2 MG/2ML IJ SOLN
INTRAMUSCULAR | Status: AC
  Filled 2023-11-05: qty 2

## 2023-11-05 MED ORDER — FENTANYL CITRATE (PF) 100 MCG/2ML IJ SOLN
INTRAMUSCULAR | Status: AC
  Filled 2023-11-05: qty 2

## 2023-11-05 MED ORDER — SODIUM CHLORIDE 0.9 % IV SOLN
INTRAVENOUS | Status: AC | PRN
  Administered 2023-11-05: 10 mL/h via INTRAVENOUS

## 2023-11-05 MED ORDER — LIDOCAINE HCL (PF) 1 % IJ SOLN
INTRAMUSCULAR | Status: DC | PRN
  Administered 2023-11-05: 2 mL via INTRADERMAL

## 2023-11-05 MED ORDER — IOHEXOL 350 MG/ML SOLN
INTRAVENOUS | Status: DC | PRN
  Administered 2023-11-05: 80 mL

## 2023-11-05 MED ORDER — MIDAZOLAM HCL 2 MG/2ML IJ SOLN
INTRAMUSCULAR | Status: DC | PRN
  Administered 2023-11-05: 2 mg via INTRAVENOUS

## 2023-11-05 MED ORDER — HEPARIN SODIUM (PORCINE) 1000 UNIT/ML IJ SOLN
INTRAMUSCULAR | Status: DC | PRN
  Administered 2023-11-05: 5000 [IU] via INTRAVENOUS

## 2023-11-05 MED ORDER — VERAPAMIL HCL 2.5 MG/ML IV SOLN
INTRAVENOUS | Status: DC | PRN
  Administered 2023-11-05: 10 mL via INTRA_ARTERIAL

## 2023-11-05 MED ORDER — VERAPAMIL HCL 2.5 MG/ML IV SOLN
INTRAVENOUS | Status: AC
  Filled 2023-11-05: qty 2

## 2023-11-05 MED ORDER — SODIUM CHLORIDE 0.9 % IV SOLN: 250.0000 mL | INTRAVENOUS | Status: DC | PRN

## 2023-11-05 MED ORDER — ROSUVASTATIN CALCIUM 20 MG PO TABS: 20.0000 mg | ORAL_TABLET | Freq: Every day | ORAL | 3 refills | Status: DC

## 2023-11-05 MED ORDER — HEPARIN (PORCINE) IN NACL 1000-0.9 UT/500ML-% IV SOLN
INTRAVENOUS | Status: DC | PRN
  Administered 2023-11-05 (×2): 500 mL

## 2023-11-05 MED ORDER — INSULIN GLARGINE 100 UNIT/ML ~~LOC~~ SOLN: 30.0000 [IU] | Freq: Every day | SUBCUTANEOUS | Status: DC

## 2023-11-05 MED ORDER — FREE WATER
500.0000 mL | Freq: Once | Status: AC
Start: 2023-11-05 — End: 2023-11-05
  Administered 2023-11-05: 500 mL via ORAL

## 2023-11-05 MED ORDER — LISINOPRIL 5 MG PO TABS: 5.0000 mg | ORAL_TABLET | Freq: Every day | ORAL | 3 refills | Status: DC

## 2023-11-05 MED ORDER — ASPIRIN 81 MG PO TBEC: 81.0000 mg | DELAYED_RELEASE_TABLET | Freq: Every day | ORAL | 12 refills | Status: AC

## 2023-11-05 MED ORDER — NITROGLYCERIN 0.4 MG SL SUBL: 0.4000 mg | SUBLINGUAL_TABLET | SUBLINGUAL | 12 refills | Status: AC | PRN

## 2023-11-05 SURGICAL SUPPLY — 10 items
CATH INFINITI 5 FR JL3.5 (CATHETERS) IMPLANT
CATH INFINITI AMBI 5FR TG (CATHETERS) IMPLANT
CATH LAUNCHER 5F AL1 (CATHETERS) IMPLANT
DEVICE RAD COMP TR BAND LRG (VASCULAR PRODUCTS) IMPLANT
GLIDESHEATH SLEND SS 6F .021 (SHEATH) IMPLANT
GUIDEWIRE INQWIRE 1.5J.035X260 (WIRE) IMPLANT
KIT SYRINGE INJ CVI SPIKEX1 (MISCELLANEOUS) IMPLANT
PACK CARDIAC CATHETERIZATION (CUSTOM PROCEDURE TRAY) ×1 IMPLANT
SET ATX-X65L (MISCELLANEOUS) IMPLANT
SHEATH PROBE COVER 6X72 (BAG) IMPLANT

## 2023-11-05 NOTE — Progress Notes (Signed)
 Patient came back from Cathlab ,upon arrival RN found TR band site bleeding  1 cc of air was added by Cathlab RN,Rt arm raised with pillow,vitals taken,pt is alert and oriented X4

## 2023-11-05 NOTE — Interval H&P Note (Signed)
 History and Physical Interval Note:  11/05/2023 12:42 PM  Ricky [name]  has presented today for surgery, with the diagnosis of unstable angina.  The various methods of treatment have been discussed with the patient and family. After consideration of risks, benefits and other options for treatment, the patient has consented to  Procedure(s): LEFT HEART CATH AND CORONARY ANGIOGRAPHY (N/A) and possible coronary intervention for unstable angina as a surgical intervention.  The patient's history has been reviewed, patient examined, no change in status, stable for surgery.  I have reviewed the patient's chart and labs.  Questions were answered to the patient's satisfaction.     Gordy Bergamo

## 2023-11-05 NOTE — Progress Notes (Signed)
 [facility]                                                                              Ricky [name], is a 33 y.o. male, DOB - [birth year], FMW:[id number] Admit date - 11/03/2023    Outpatient Primary MD for the patient is Colette Torrence GRADE, MD  LOS - 2  days  Chief Complaint  Patient presents with   Chest Pain   Back Pain       Brief summary   Patient is a 33 year old male with asthma, IDDM, hyperlipidemia, mood disorder presented with chest pain, intermittent episodes associated with dyspnea for several months.  Symptoms are not necessarily associated with exertion.  He works at a [employer] and a day before the admission at work he started having chest tightness again and lasted all day prompting him to go to ED.  Patient reports strong family history of CAD with both parents, maternal/paternal grandparents.  Troponin 19> 22> 21> 20, D-dimer negative.  CTA chest/abdomen/pelvis negative for PE, aortic aneurysm, or dissection.  EKG showing sinus rhythm and new T wave inversions in inferior leads compared to previous EKG from April 2023.  Cardiology consulted  Assessment & Plan      Chest pain - In the setting of IDDM, hyperlipidemia, obesity, strong family history of CAD, EKG changes, + troponins -Cardiology consulted, continue IV heparin  drip, sublingual nitro, rosuvastatin  - 2D echo showed EF of 60 to 65%, no regional WMA, moderate LVH, normal RV systolic function  - Plan for cardiac cath today  - Continue aspirin , Crestor , IV heparin     Asthma -Stable, no wheezing - Does not use inhalers at home.   Uncontrolled insulin -dependent type 2 diabetes with hyperglycemia -Hemoglobin A1c 12.4 - Increased Lantus  to 30 units daily, NovoLog  6 units 3 times daily AC, moderate SSI  - Diabetic coordinator consult CBG (last 3)  Recent Labs    11/04/23 2104 11/05/23 0626 11/05/23 1138  GLUCAP 239* 329* 226*       Hypertriglyceridemia -Lipid panel showed  cholesterol 324, HDL 39, LDL 195, triglycerides 717 -Continue rosuvastatin , fenofibrate  - Will benefit from lipid clinic referral as outpatient    Mood disorder Continue Atarax  as needed, lamotrigine  25 mg daily  Obesity class I Estimated body mass index is 33.41 kg/m as calculated from the following:   Height as of this encounter: 5' 11.5 (1.816 m).   Weight as of this encounter: 110.2 kg.  Code Status: Full code DVT Prophylaxis:  IV heparin  drip   Level of Care: Level of care: Progressive Family Communication: Updated patient Disposition Plan:      Remains inpatient appropriate: Cardiac cath today   Procedures:    Consultants:   Cardiology  Antimicrobials:   Anti-infectives (From admission, onward)    None          Medications  aspirin  EC  81 mg Oral Daily   fenofibrate   160 mg Oral Daily   insulin  aspart  0-15 Units Subcutaneous TID WC   insulin  aspart  0-5 Units Subcutaneous QHS   insulin  aspart  6 Units Subcutaneous TID WC   [START ON 11/06/2023] insulin  glargine  30 Units Subcutaneous Daily   lamoTRIgine   25 mg Oral Daily   lisinopril   5 mg Oral Daily   living well with diabetes book   Does not apply Once   pantoprazole   40 mg Oral Daily   rosuvastatin   20 mg Oral Daily      Subjective:   Ricky [name] was seen and examined today.  No acute complaints.  Awaiting cardiac cath today.  No acute dizziness, chest pain, shortness of breath, fevers or chills.  Objective:   Vitals:   11/05/23 0357 11/05/23 0726 11/05/23 0944 11/05/23 1136  BP: 119/83 131/85 134/78 138/88  Pulse: 85 82 88 86  Resp: 18 18 18 18   Temp: 97.9 F (36.6 C) 97.8 F (36.6 C)  98.2 F (36.8 C)  TempSrc: Oral Oral  Oral  SpO2: 100% 100%  100%  Weight:      Height:        Intake/Output Summary (Last 24 hours) at 11/05/2023 1159 Last data filed at 11/05/2023 0429 Gross per 24 hour  Intake 1193.71 ml  Output --  Net 1193.71 ml     Wt Readings from Last 3  Encounters:  11/05/23 110.2 kg  07/25/23 112.4 kg  07/11/23 112.1 kg    Physical Exam General: Alert and oriented x 3, NAD Cardiovascular: S1 S2 clear, RRR.  Respiratory: CTAB Gastrointestinal: Soft, nontender, nondistended, NBS Ext: no pedal edema bilaterally Neuro: no new deficits Psych: Normal affect      Data Reviewed:  I have personally reviewed following labs    CBC Lab Results  Component Value Date   WBC 6.7 11/05/2023   RBC 4.89 11/05/2023   HGB 13.0 11/05/2023   HCT 40.0 11/05/2023   MCV 81.8 11/05/2023   MCH 26.6 11/05/2023   PLT 269 11/05/2023   MCHC 32.5 11/05/2023   RDW 13.2 11/05/2023   LYMPHSABS 3.6 05/30/2021   MONOABS 0.5 05/30/2021   EOSABS 0.1 05/30/2021   BASOSABS 0.0 05/30/2021     Last metabolic panel Lab Results  Component Value Date   NA 134 (L) 11/05/2023   K 3.4 (L) 11/05/2023   CL 101 11/05/2023   CO2 24 11/05/2023   BUN 13 11/05/2023   CREATININE 0.97 11/05/2023   GLUCOSE 283 (H) 11/05/2023   GFRNONAA >60 11/05/2023   CALCIUM  9.1 11/05/2023   PHOS 4.3 11/05/2023   PROT 7.4 11/04/2023   ALBUMIN 3.3 (L) 11/05/2023   LABGLOB 3.0 07/11/2023   BILITOT 0.6 11/04/2023   ALKPHOS 93 11/04/2023   AST 20 11/04/2023   ALT 31 11/04/2023   ANIONGAP 9 11/05/2023    CBG (last 3)  Recent Labs    11/04/23 2104 11/05/23 0626 11/05/23 1138  GLUCAP 239* 329* 226*      Coagulation Profile: No results for input(s): INR, PROTIME in the last 168 hours.   Radiology Studies: I have personally reviewed the imaging studies  ECHOCARDIOGRAM COMPLETE Result Date: 11/04/2023    ECHOCARDIOGRAM REPORT   Patient Name:   Ricky [name] Date of Exam: 11/04/2023 Medical Rec #:  [id number]          Height:       71.5 in Accession #:    [id number]         Weight:       241.4 lb Date of Birth:  [birth year]          BSA:          2.296 m Patient Age:    32 years  BP:           119/77 mmHg Patient Gender: M                  HR:           81  bpm. Exam Location:  Inpatient Procedure: 2D Echo, Cardiac Doppler and Color Doppler (Both Spectral and Color            Flow Doppler were utilized during procedure). Indications:    NSTEMI I21.4  History:        Patient has no prior history of Echocardiogram examinations.                 Angina, Signs/Symptoms:Chest Pain; Risk Factors:Hypertension,                 Diabetes and Dyslipidemia.  Sonographer:    Thea Norlander RCS Referring Phys: SUBRINA SUNDIL IMPRESSIONS  1. Left ventricular ejection fraction, by estimation, is 60 to 65%. The left ventricle has normal function. The left ventricle has no regional wall motion abnormalities. There is moderate left ventricular hypertrophy. Left ventricular diastolic parameters were normal.  2. Right ventricular systolic function is normal. The right ventricular size is normal.  3. The mitral valve is normal in structure. Trivial mitral valve regurgitation. No evidence of mitral stenosis.  4. The aortic valve is tricuspid. Aortic valve regurgitation is not visualized. No aortic stenosis is present. FINDINGS  Left Ventricle: Left ventricular ejection fraction, by estimation, is 60 to 65%. The left ventricle has normal function. The left ventricle has no regional wall motion abnormalities. The left ventricular internal cavity size was small. There is moderate  left ventricular hypertrophy. Left ventricular diastolic parameters were normal. Right Ventricle: The right ventricular size is normal. No increase in right ventricular wall thickness. Right ventricular systolic function is normal. Left Atrium: Left atrial size was normal in size. Right Atrium: Right atrial size was normal in size. Pericardium: Trivial pericardial effusion is present. Mitral Valve: The mitral valve is normal in structure. Trivial mitral valve regurgitation. No evidence of mitral valve stenosis. Tricuspid Valve: The tricuspid valve is normal in structure. Tricuspid valve regurgitation is trivial.  Aortic Valve: The aortic valve is tricuspid. Aortic valve regurgitation is not visualized. No aortic stenosis is present. Aortic valve peak gradient measures 4.5 mmHg. Pulmonic Valve: The pulmonic valve was not well visualized. Pulmonic valve regurgitation is not visualized. Aorta: The aortic root and ascending aorta are structurally normal, with no evidence of dilitation. IAS/Shunts: The interatrial septum was not well visualized.  LEFT VENTRICLE PLAX 2D LVIDd:         3.30 cm   Diastology LVIDs:         2.10 cm   LV e' medial:    7.62 cm/s LV PW:         1.20 cm   LV E/e' medial:  9.3 LV IVS:        1.20 cm   LV e' lateral:   12.10 cm/s LVOT diam:     2.60 cm   LV E/e' lateral: 5.8 LV SV:         78 LV SV Index:   34 LVOT Area:     5.31 cm  RIGHT VENTRICLE RV S prime:     17.40 cm/s TAPSE (M-mode): 1.9 cm LEFT ATRIUM           Index        RIGHT ATRIUM  Index LA diam:      4.10 cm 1.79 cm/m   RA Area:     12.70 cm LA Vol (A2C): 32.4 ml 14.11 ml/m  RA Volume:   26.70 ml  11.63 ml/m LA Vol (A4C): 46.3 ml 20.17 ml/m  AORTIC VALVE AV Area (Vmax): 4.25 cm AV Vmax:        106.00 cm/s AV Peak Grad:   4.5 mmHg LVOT Vmax:      84.90 cm/s LVOT Vmean:     56.600 cm/s LVOT VTI:       0.147 m  AORTA Ao Root diam: 3.40 cm Ao Asc diam:  3.10 cm MITRAL VALVE MV Area (PHT): 3.77 cm    SHUNTS MV Decel Time: 201 msec    Systemic VTI:  0.15 m MV E velocity: 70.70 cm/s  Systemic Diam: 2.60 cm MV A velocity: 51.90 cm/s MV E/A ratio:  1.36 Lonni Nanas MD Electronically signed by Lonni Nanas MD Signature Date/Time: 11/04/2023/12:37:18 PM    Final    CT Angio Chest/Abd/Pel for Dissection W and/or Wo Contrast Result Date: 11/03/2023 CLINICAL DATA:  Chest tightness, short of breath, lower back pain yesterday while at work, generalized fatigue EXAM: CT ANGIOGRAPHY CHEST, ABDOMEN AND PELVIS TECHNIQUE: Non-contrast CT of the chest was initially obtained. Multidetector CT imaging through the chest, abdomen  and pelvis was performed using the standard protocol during bolus administration of intravenous contrast. Multiplanar reconstructed images and MIPs were obtained and reviewed to evaluate the vascular anatomy. RADIATION DOSE REDUCTION: This exam was performed according to the departmental dose-optimization program which includes automated exposure control, adjustment of the mA and/or kV according to patient size and/or use of iterative reconstruction technique. CONTRAST:  OMNIPAQUE  IOHEXOL  350 MG/ML SOLN COMPARISON:  11/03/2023 FINDINGS: CTA CHEST FINDINGS Cardiovascular: The heart is unremarkable without pericardial effusion. No evidence of thoracic aortic aneurysm or dissection. There is technically adequate opacification of the pulmonary vasculature. No filling defects or pulmonary emboli. Mediastinum/Nodes: No enlarged mediastinal, hilar, or axillary lymph nodes. Thyroid gland, trachea, and esophagus demonstrate no significant findings. Lungs/Pleura: No acute airspace disease, effusion, or pneumothorax. Central airways are patent. Musculoskeletal: No acute or destructive bony abnormalities. Reconstructed images demonstrate no additional findings. Review of the MIP images confirms the above findings. CTA ABDOMEN AND PELVIS FINDINGS VASCULAR Aorta: Normal caliber aorta without aneurysm, dissection, vasculitis or significant stenosis. Celiac: Patent without evidence of aneurysm, dissection, vasculitis or significant stenosis. SMA: Patent without evidence of aneurysm, dissection, vasculitis or significant stenosis. Renals: Both renal arteries are patent without evidence of aneurysm, dissection, vasculitis, fibromuscular dysplasia or significant stenosis. IMA: Patent without evidence of aneurysm, dissection, vasculitis or significant stenosis. Inflow: Patent without evidence of aneurysm, dissection, vasculitis or significant stenosis. Veins: No obvious venous abnormality within the limitations of this arterial  phase study. Review of the MIP images confirms the above findings. NON-VASCULAR Hepatobiliary: No focal liver abnormality is seen. No gallstones, gallbladder wall thickening, or biliary dilatation. Pancreas: Unremarkable. No pancreatic ductal dilatation or surrounding inflammatory changes. Spleen: Normal in size without focal abnormality. Adrenals/Urinary Tract: No urinary tract calculi or obstructive uropathy within either kidney. Kidneys enhance normally. The adrenals and bladder are unremarkable. Stomach/Bowel: No bowel obstruction or ileus. Normal appendix right lower quadrant. No bowel wall thickening or inflammatory change. Lymphatic: No pathologic adenopathy. Reproductive: Prostate is unremarkable. Other: No free fluid or free intraperitoneal gas. No abdominal wall hernia. Musculoskeletal: No acute or destructive bony abnormalities. Reconstructed images demonstrate no additional findings. Review of the MIP images  confirms the above findings. IMPRESSION: 1. No evidence of thoracoabdominal aortic aneurysm or dissection. 2. No evidence of pulmonary embolus. 3. No acute intrathoracic, intra-abdominal, or intrapelvic process. Electronically Signed   By: Ozell Daring M.D.   On: 11/03/2023 17:21       Foday Cone M.D. [facility] 11/05/2023, 11:59 AM  Available via Epic secure chat 7am-7pm After 7 pm, please refer to night coverage provider listed on amion.

## 2023-11-05 NOTE — Inpatient Diabetes Management (Signed)
 Inpatient Diabetes Program Recommendations  AACE/ADA: New Consensus Statement on Inpatient Glycemic Control   Target Ranges:  Prepandial:   less than 140 mg/dL      Peak postprandial:   less than 180 mg/dL (1-2 hours)      Critically ill patients:  140 - 180 mg/dL    Latest Reference Range & Units 11/04/23 06:27 11/04/23 13:41 11/04/23 15:20 11/04/23 17:13 11/04/23 21:04 11/05/23 06:26  Glucose-Capillary 70 - 99 mg/dL 747 (H) 641 (H) 668 (H) 320 (H) 239 (H) 329 (H)   Review of Glycemic Control  Diabetes history: DM2 Outpatient Diabetes medications: Lantus  40 units daily, Metformin  1000 mg BID, Jardiance  25 mg daily Current orders for Inpatient glycemic control: Lantus  20 units daily, Novolog  0-15 units TID with meals, Novolog  0-5 units at bedtime, Novolog  3 units TID with meals  Inpatient Diabetes Program Recommendations:    Insulin : Please consider increasing Lantus  to 30  units daily and meal coverage to Novolog  6 units TID with meals (once diet resumed after cath today).   Thanks, Earnie Gainer, RN, MSN, CDCES Diabetes Coordinator Inpatient Diabetes Program [phone] (Team Pager from 8am to 5pm)

## 2023-11-05 NOTE — Progress Notes (Signed)
 PHARMACY - ANTICOAGULATION CONSULT NOTE  Pharmacy Consult for heparin  Indication: chest pain/ACS  No Known Allergies  Patient Measurements: Height: 5' 11.5 (181.6 cm) Weight: 110.2 kg (242 lb 14.4 oz) IBW/kg (Calculated) : 76.45 HEPARIN  DW (KG): 99.7  Vital Signs: Temp: 97.8 F (36.6 C) (09/15 0726) Temp Source: Oral (09/15 0726) BP: 131/85 (09/15 0726) Pulse Rate: 82 (09/15 0726)  Labs: Recent Labs    11/03/23 1138 11/03/23 2231 11/04/23 0009 11/04/23 0022 11/04/23 0227 11/04/23 0801 11/04/23 1554 11/04/23 2224 11/05/23 0352  HGB 13.2  --   --   --  13.3  --   --   --  13.0  HCT 39.5  --   --   --  40.0  --   --   --  40.0  PLT 288  --   --   --  306  --   --   --  269  APTT  --   --   --   --   --   --  77* 80*  --   HEPARINUNFRC  --   --   --    < >  --  0.13* 0.31 0.36  --   CREATININE 1.00  --   --   --  0.79  --   --   --  0.97  TROPONINIHS  --  3 4  --   --   --   --   --   --    < > = values in this interval not displayed.    Estimated Creatinine Clearance: 140.5 mL/min (by C-G formula based on SCr of 0.97 mg/dL).   Medical History: Past Medical History:  Diagnosis Date   Asthma    Diabetes mellitus without complication (HCC)    Mood disorder (HCC)      Assessment: 62 YOM presenting with chest tightness for months --> possible NSTEMI. He is not on anticoagulation PTA. Pharmacy consulted to dose heparin   -aPTT (80s) and heparin  level (0.36) at goal on 1900 units/hr -plans noted for cath today  Goal of Therapy:  Heparin  level 0.3-0.7 units/ml Monitor platelets by anticoagulation protocol: Yes   Plan:  -Continue heparin  gtt at 1900 units/hr -Will follow plans post cath  Prentice Poisson, PharmD Clinical Pharmacist **Pharmacist phone directory can now be found on [email or web address] ([email or web address]).  Listed under [facility].

## 2023-11-05 NOTE — Progress Notes (Signed)
 DISCHARGE NOTE HOME Ricky [name] to be discharged Home per MD order. Discussed prescriptions and follow up appointments with the patient. Prescriptions given to patient; medication list explained in detail. Patient verbalized understanding.  Skin clean, dry and intact without evidence of skin break down, no evidence of skin tears noted. IV catheter discontinued intact. Site without signs and symptoms of complications. Dressing and pressure applied. Pt denies pain at the site currently. No complaints noted.  Patient free of lines, drains, and wounds.   An After Visit Summary (AVS) was printed and given to the patient. Patient escorted via wheelchair, and discharged home via private auto.  Peyton SHAUNNA Pepper, RN

## 2023-11-05 NOTE — Progress Notes (Signed)
  Progress Note  Patient Name: Ricky [name] Date of Encounter: 11/05/2023 [facility] Cardiologist: Lonni LITTIE Nanas, MD    Interval Summary   Patient overall feeling well this AM. No shortness of breath. Had a mild episode of chest pain this AM which has since resolved. Scheduled for cardiac catheterization today     Vital Signs Vitals:   11/05/23 0011 11/05/23 0336 11/05/23 0357 11/05/23 0726  BP: 109/72  119/83 131/85  Pulse: 90  85 82  Resp: 16  18 18   Temp: 98.1 F (36.7 C)  97.9 F (36.6 C) 97.8 F (36.6 C)  TempSrc: Oral  Oral Oral  SpO2: 98%  100% 100%  Weight:  110.2 kg    Height:        Intake/Output Summary (Last 24 hours) at 11/05/2023 0915 Last data filed at 11/05/2023 0429 Gross per 24 hour  Intake 1193.71 ml  Output --  Net 1193.71 ml      11/05/2023    3:36 AM 11/03/2023    9:43 PM 11/03/2023   11:28 AM  Last 3 Weights  Weight (lbs) 242 lb 14.4 oz 241 lb 6.4 oz 250 lb  Weight (kg) 110.179 kg 109.498 kg 113.399 kg      Telemetry/ECG  NSR - Personally Reviewed  Physical Exam  GEN: No acute distress.  Sitting upright in the bed  Neck: No JVD Cardiac:  RRR, no murmurs, rubs, or gallops.  Respiratory: Clear to auscultation bilaterally. Normal WOB on room air  GI: Soft, nontender, non-distended  MS: No edema  Assessment & Plan   Unstable Angina  - Patient presented with chest tightness and shortness of breath that was waxing and waning throughout the day yesterday, resolved with nitroglycerin  in the ED. High sensitivity troponin T 22>21>20  - Echocardiogram this admission showed EF 60-65%, no regional wall motion abnormalities, moderate LVH, normal RV systolic function  - Despite age, does have multiple risk factors for CAD with strong family history (reports mother had MI in late 30s/early 80s), very poorly controlled diabetes (A1c 14.9 06/2023), hyperlipidemia (LDL 195)  - Pending cath today for unstable angina - see progress  note 9/14 for consent  - Continue IV heparin  prior to cath  - Continue ASA 81 mg daily  - Continue crestor  20 mg daily   HTN  - BP well controlled  - Continue lisinopril  5 mg dailyh   HLD  - Lipid panel this admission with LDL 195, HDL 39, triglycerides 717, total cholesterol 324  - Ordered LP(a)  - Continue crestor  20 mg daily (new this admission)  - Continue fenofibrate  160 mg daily  - Needs lipid clinic referral at DC   Type 2 DM - A1c 12.4 this admission - On insulin    For questions or updates, please contact [facility] Please consult [email or web address] for contact info under   I spent 15 minutes seeing this patient. During that time, I reviewed their history, reviewed chart, reviewed available labs, performed physical exam, and formulated an assessment and plan        Signed, Rollo FABIENE Louder, PA-C

## 2023-11-05 NOTE — H&P (View-Only) (Signed)
  Progress Note  Patient Name: Ricky [name] Date of Encounter: 11/05/2023 [facility] Cardiologist: Lonni LITTIE Nanas, MD    Interval Summary   Patient overall feeling well this AM. No shortness of breath. Had a mild episode of chest pain this AM which has since resolved. Scheduled for cardiac catheterization today     Vital Signs Vitals:   11/05/23 0011 11/05/23 0336 11/05/23 0357 11/05/23 0726  BP: 109/72  119/83 131/85  Pulse: 90  85 82  Resp: 16  18 18   Temp: 98.1 F (36.7 C)  97.9 F (36.6 C) 97.8 F (36.6 C)  TempSrc: Oral  Oral Oral  SpO2: 98%  100% 100%  Weight:  110.2 kg    Height:        Intake/Output Summary (Last 24 hours) at 11/05/2023 0915 Last data filed at 11/05/2023 0429 Gross per 24 hour  Intake 1193.71 ml  Output --  Net 1193.71 ml      11/05/2023    3:36 AM 11/03/2023    9:43 PM 11/03/2023   11:28 AM  Last 3 Weights  Weight (lbs) 242 lb 14.4 oz 241 lb 6.4 oz 250 lb  Weight (kg) 110.179 kg 109.498 kg 113.399 kg      Telemetry/ECG  NSR - Personally Reviewed  Physical Exam  GEN: No acute distress.  Sitting upright in the bed  Neck: No JVD Cardiac:  RRR, no murmurs, rubs, or gallops.  Respiratory: Clear to auscultation bilaterally. Normal WOB on room air  GI: Soft, nontender, non-distended  MS: No edema  Assessment & Plan   Unstable Angina  - Patient presented with chest tightness and shortness of breath that was waxing and waning throughout the day yesterday, resolved with nitroglycerin  in the ED. High sensitivity troponin T 22>21>20  - Echocardiogram this admission showed EF 60-65%, no regional wall motion abnormalities, moderate LVH, normal RV systolic function  - Despite age, does have multiple risk factors for CAD with strong family history (reports mother had MI in late 30s/early 80s), very poorly controlled diabetes (A1c 14.9 06/2023), hyperlipidemia (LDL 195)  - Pending cath today for unstable angina - see progress  note 9/14 for consent  - Continue IV heparin  prior to cath  - Continue ASA 81 mg daily  - Continue crestor  20 mg daily   HTN  - BP well controlled  - Continue lisinopril  5 mg dailyh   HLD  - Lipid panel this admission with LDL 195, HDL 39, triglycerides 717, total cholesterol 324  - Ordered LP(a)  - Continue crestor  20 mg daily (new this admission)  - Continue fenofibrate  160 mg daily  - Needs lipid clinic referral at DC   Type 2 DM - A1c 12.4 this admission - On insulin    For questions or updates, please contact [facility] Please consult [email or web address] for contact info under   I spent 15 minutes seeing this patient. During that time, I reviewed their history, reviewed chart, reviewed available labs, performed physical exam, and formulated an assessment and plan        Signed, Rollo FABIENE Louder, PA-C

## 2023-11-05 NOTE — Discharge Summary (Signed)
 Physician Discharge Summary   Patient: Ricky [name] MRN: [id number] DOB: 05-Jan-1991  Admit date:     11/03/2023  Discharge date: 11/05/23  Discharge Physician: Nydia Distance, MD    PCP: Colette Torrence GRADE, MD   Recommendations at discharge:    Started on fenofibrate  160mg  daily  Lisinopril  5mg  po daily  Crestor  20mg  po daily Recommend out patient lipid clinic referral  Strongly recommended to be complaint with insulin  and DM regimen, need better glycemic control   Discharge Diagnoses:    Chest pain:   Uncontrolled diabetes mellitus with hyperglycemia, with long-term current use of insulin  (HCC)   Hyperlipidemia   Asthma, chronic   Unstable angina [facility])   Essential hypertension    Hospital Course:  Patient is a 33 year old male with asthma, IDDM, hyperlipidemia, mood disorder presented with chest pain, intermittent episodes associated with dyspnea for several months.  Symptoms are not necessarily associated with exertion.  He works at a [employer] and a day before the admission at work he started having chest tightness again and lasted all day prompting him to go to ED.  Patient reports strong family history of CAD with both parents, maternal/paternal grandparents.   Troponin 19> 22> 21> 20, D-dimer negative.  CTA chest/abdomen/pelvis negative for PE, aortic aneurysm, or dissection.  EKG showing sinus rhythm and new T wave inversions in inferior leads compared to previous EKG from April 2023.  Cardiology consulted   Assessment and Plan:  Chest pain - In the setting of IDDM, hyperlipidemia, obesity, strong family history of CAD, EKG changes, + troponins -Cardiology consulted, continue IV heparin  drip, sublingual nitro, rosuvastatin  - 2D echo showed EF of 60 to 65%, no regional WMA, moderate LVH, normal RV systolic function  - Underwent cardiac cath today, per cardiology, moderate disease in LAD, cleared to discharge home and will follow up in office      Asthma -Stable, no  wheezing - Does not use inhalers at home.   Uncontrolled insulin -dependent type 2 diabetes with hyperglycemia -Hemoglobin A1c 12.4 - noncompliant. Needs better glycemic control, diet control  and better choices. Received diabetic education, follows endocrinology.        Hypertriglyceridemia -Lipid panel showed cholesterol 324, HDL 39, LDL 195, triglycerides 717 -Continue rosuvastatin , fenofibrate  - Will benefit from lipid clinic referral as outpatient     Mood disorder Continue Atarax  as needed, lamotrigine  25 mg daily   Obesity class I Estimated body mass index is 33.41 kg/m as calculated from the following:   Height as of this encounter: 5' 11.5 (1.816 m).   Weight as of this encounter: 110.2 kg.       Pain control - [place]  Controlled Substance Reporting System database was reviewed. and patient was instructed, not to drive, operate heavy machinery, perform activities at heights, swimming or participation in water  activities or provide baby-sitting services while on Pain, Sleep and Anxiety Medications; until their outpatient Physician has advised to do so again. Also recommended to not to take more than prescribed Pain, Sleep and Anxiety Medications.  Consultants: cardiology  Procedures performed: cardiac cath   Disposition: Home Diet recommendation:  Discharge Diet Orders (From admission, onward)     Start     Ordered   11/05/23 0000  Diet Carb Modified        11/05/23 1459            DISCHARGE MEDICATION: Allergies as of 11/05/2023   No Known Allergies      Medication List     TAKE  these medications    aspirin  EC 81 MG tablet Take 1 tablet (81 mg total) by mouth daily. Swallow whole. Start taking on: November 06, 2023   [facility] G7 Receiver Espiridion Use for checking sugars   Dexcom G7 Sensor Misc Check sugars daily   empagliflozin  25 MG Tabs tablet Commonly known as: JARDIANCE  Take 1 tablet (25 mg total) by mouth daily.   fenofibrate  160 MG  tablet Take 1 tablet (160 mg total) by mouth daily. Start taking on: November 06, 2023 What changed:  medication strength how much to take   hydrOXYzine  10 MG tablet Commonly known as: ATARAX  Take 1 tablet (10 mg total) by mouth at bedtime as needed. What changed: reasons to take this   insulin  glargine 100 UNIT/ML injection Commonly known as: Lantus  Inject 0.4 mLs (40 Units total) into the skin daily.   lamoTRIgine  25 MG tablet Commonly known as: LaMICtal  Take 1 tablet (25 mg total) by mouth daily for 7 days, THEN 2 tablets (50 mg total) daily. Start taking on: October 10, 2023 What changed: See the new instructions.   lisinopril  5 MG tablet Commonly known as: ZESTRIL  Take 1 tablet (5 mg total) by mouth daily. Start taking on: November 06, 2023   metFORMIN  500 MG 24 hr tablet Commonly known as: GLUCOPHAGE -XR TAKE 2 TABLETS (1,000 MG TOTAL) BY MOUTH 2 (TWO) TIMES DAILY WITH A MEAL.   nitroGLYCERIN  0.4 MG SL tablet Commonly known as: NITROSTAT  Place 1 tablet (0.4 mg total) under the tongue every 5 (five) minutes as needed for chest pain.   pantoprazole  40 MG tablet Commonly known as: PROTONIX  Take 1 tablet (40 mg total) by mouth daily.   rosuvastatin  20 MG tablet Commonly known as: CRESTOR  Take 1 tablet (20 mg total) by mouth daily. Start taking on: November 06, 2023        Follow-up Information     [facility] [employer] [facility] [employer] [employer] .   Specialty: Cardiology Contact information: [address] [place]  [address] [phone]        [facility] Emergency Department at [facility] .   Specialty: Emergency Medicine Why: As needed, If symptoms worsen Contact information: [address] [place]  [address] [phone]        Colette Torrence GRADE, MD. Schedule an appointment as soon as possible for a visit in 2 week(s).   Specialty: Family Medicine Why: for hospital  follow-up Contact information: [address] [place] [address] [phone]                Discharge Exam: Filed Weights   11/03/23 1128 11/03/23 2143 11/05/23 0336  Weight: 113.4 kg 109.5 kg 110.2 kg   S: no acute issues, cleared by cardiology to discharge home   BP 135/79 (BP Location: Left Arm)   Pulse 91   Temp 98.4 F (36.9 C) (Oral)   Resp 18   Ht 5' 11.5 (1.816 m)   Wt 110.2 kg   SpO2 100%   BMI 33.41 kg/m   Physical Exam General: Alert and oriented x 3, NAD Cardiovascular: S1 S2 clear, RRR.  Respiratory: CTAB, no wheezing, rales or rhonchi Gastrointestinal: Soft, nontender, nondistended, NBS Ext: no pedal edema bilaterally Neuro: no new deficits Psych: Normal affect    Condition at discharge: fair  The results of significant diagnostics from this hospitalization (including imaging, microbiology, ancillary and laboratory) are listed below for reference.   Imaging  Studies: CARDIAC CATHETERIZATION Result Date: 11/05/2023 Images from the original result were not included. Cardiac Catheterization 11/05/23: Hemodynamic data: LV 127/6, EDP 15 mmHg.  Ao 117/81, mean 99 mmHg.  No pressure gradient across the aortic valve. Angiographic data: Slow flow noted in the coronary vessels suggestive of microvascular dysfunction. LM: Large-caliber vessel.  Has a superior takeoff and best engaged with AL 2.  Smooth and normal. LAD: Gives origin to small to moderate-sized D1 and a very large D2.  LAD ends at the apex.  Mid to distal LAD has mild to moderate diffuse disease and a focal 30% stenosis in the midsegment. LCx: Large-caliber vessel, gives origin to large OM 3.  Mid to distal Cx has 30% stenosis. RI: Moderate caliber vessel, smooth and normal. RCA: Large-caliber vessel.  Has mild disease in the distal segment, large PDA and small PL branch. Impression and recommendations: Slow flow noted in the coronary vessels suggestive of microvascular dysfunction.  Moderate  disease especially in the mid to distal LAD and mild disease in the distal RCA.  Recommend aggressive risk modification.   ECHOCARDIOGRAM COMPLETE Result Date: 11/04/2023    ECHOCARDIOGRAM REPORT   Patient Name:   Emersen Carroll Date of Exam: 11/04/2023 Medical Rec #:  [id number]          Height:       71.5 in Accession #:    [id number]         Weight:       241.4 lb Date of Birth:  1990/11/16          BSA:          2.296 m Patient Age:    32 years           BP:           119/77 mmHg Patient Gender: M                  HR:           81 bpm. Exam Location:  Inpatient Procedure: 2D Echo, Cardiac Doppler and Color Doppler (Both Spectral and Color            Flow Doppler were utilized during procedure). Indications:    NSTEMI I21.4  History:        Patient has no prior history of Echocardiogram examinations.                 Angina, Signs/Symptoms:Chest Pain; Risk Factors:Hypertension,                 Diabetes and Dyslipidemia.  Sonographer:    Thea Norlander RCS Referring Phys: SUBRINA SUNDIL IMPRESSIONS  1. Left ventricular ejection fraction, by estimation, is 60 to 65%. The left ventricle has normal function. The left ventricle has no regional wall motion abnormalities. There is moderate left ventricular hypertrophy. Left ventricular diastolic parameters were normal.  2. Right ventricular systolic function is normal. The right ventricular size is normal.  3. The mitral valve is normal in structure. Trivial mitral valve regurgitation. No evidence of mitral stenosis.  4. The aortic valve is tricuspid. Aortic valve regurgitation is not visualized. No aortic stenosis is present. FINDINGS  Left Ventricle: Left ventricular ejection fraction, by estimation, is 60 to 65%. The left ventricle has normal function. The left ventricle has no regional wall motion abnormalities. The left ventricular internal cavity size was small. There is moderate  left ventricular hypertrophy. Left ventricular diastolic parameters were  normal. Right Ventricle: The right ventricular size is  normal. No increase in right ventricular wall thickness. Right ventricular systolic function is normal. Left Atrium: Left atrial size was normal in size. Right Atrium: Right atrial size was normal in size. Pericardium: Trivial pericardial effusion is present. Mitral Valve: The mitral valve is normal in structure. Trivial mitral valve regurgitation. No evidence of mitral valve stenosis. Tricuspid Valve: The tricuspid valve is normal in structure. Tricuspid valve regurgitation is trivial. Aortic Valve: The aortic valve is tricuspid. Aortic valve regurgitation is not visualized. No aortic stenosis is present. Aortic valve peak gradient measures 4.5 mmHg. Pulmonic Valve: The pulmonic valve was not well visualized. Pulmonic valve regurgitation is not visualized. Aorta: The aortic root and ascending aorta are structurally normal, with no evidence of dilitation. IAS/Shunts: The interatrial septum was not well visualized.  LEFT VENTRICLE PLAX 2D LVIDd:         3.30 cm   Diastology LVIDs:         2.10 cm   LV e' medial:    7.62 cm/s LV PW:         1.20 cm   LV E/e' medial:  9.3 LV IVS:        1.20 cm   LV e' lateral:   12.10 cm/s LVOT diam:     2.60 cm   LV E/e' lateral: 5.8 LV SV:         78 LV SV Index:   34 LVOT Area:     5.31 cm  RIGHT VENTRICLE RV S prime:     17.40 cm/s TAPSE (M-mode): 1.9 cm LEFT ATRIUM           Index        RIGHT ATRIUM           Index LA diam:      4.10 cm 1.79 cm/m   RA Area:     12.70 cm LA Vol (A2C): 32.4 ml 14.11 ml/m  RA Volume:   26.70 ml  11.63 ml/m LA Vol (A4C): 46.3 ml 20.17 ml/m  AORTIC VALVE AV Area (Vmax): 4.25 cm AV Vmax:        106.00 cm/s AV Peak Grad:   4.5 mmHg LVOT Vmax:      84.90 cm/s LVOT Vmean:     56.600 cm/s LVOT VTI:       0.147 m  AORTA Ao Root diam: 3.40 cm Ao Asc diam:  3.10 cm MITRAL VALVE MV Area (PHT): 3.77 cm    SHUNTS MV Decel Time: 201 msec    Systemic VTI:  0.15 m MV E velocity: 70.70 cm/s  Systemic  Diam: 2.60 cm MV A velocity: 51.90 cm/s MV E/A ratio:  1.36 Lonni Nanas MD Electronically signed by Lonni Nanas MD Signature Date/Time: 11/04/2023/12:37:18 PM    Final    CT Angio Chest/Abd/Pel for Dissection W and/or Wo Contrast Result Date: 11/03/2023 CLINICAL DATA:  Chest tightness, short of breath, lower back pain yesterday while at work, generalized fatigue EXAM: CT ANGIOGRAPHY CHEST, ABDOMEN AND PELVIS TECHNIQUE: Non-contrast CT of the chest was initially obtained. Multidetector CT imaging through the chest, abdomen and pelvis was performed using the standard protocol during bolus administration of intravenous contrast. Multiplanar reconstructed images and MIPs were obtained and reviewed to evaluate the vascular anatomy. RADIATION DOSE REDUCTION: This exam was performed according to the departmental dose-optimization program which includes automated exposure control, adjustment of the mA and/or kV according to patient size and/or use of iterative reconstruction technique. CONTRAST:  OMNIPAQUE  IOHEXOL  350 MG/ML SOLN COMPARISON:  11/03/2023 FINDINGS: CTA CHEST FINDINGS Cardiovascular: The heart is unremarkable without pericardial effusion. No evidence of thoracic aortic aneurysm or dissection. There is technically adequate opacification of the pulmonary vasculature. No filling defects or pulmonary emboli. Mediastinum/Nodes: No enlarged mediastinal, hilar, or axillary lymph nodes. Thyroid gland, trachea, and esophagus demonstrate no significant findings. Lungs/Pleura: No acute airspace disease, effusion, or pneumothorax. Central airways are patent. Musculoskeletal: No acute or destructive bony abnormalities. Reconstructed images demonstrate no additional findings. Review of the MIP images confirms the above findings. CTA ABDOMEN AND PELVIS FINDINGS VASCULAR Aorta: Normal caliber aorta without aneurysm, dissection, vasculitis or significant stenosis. Celiac: Patent without evidence of  aneurysm, dissection, vasculitis or significant stenosis. SMA: Patent without evidence of aneurysm, dissection, vasculitis or significant stenosis. Renals: Both renal arteries are patent without evidence of aneurysm, dissection, vasculitis, fibromuscular dysplasia or significant stenosis. IMA: Patent without evidence of aneurysm, dissection, vasculitis or significant stenosis. Inflow: Patent without evidence of aneurysm, dissection, vasculitis or significant stenosis. Veins: No obvious venous abnormality within the limitations of this arterial phase study. Review of the MIP images confirms the above findings. NON-VASCULAR Hepatobiliary: No focal liver abnormality is seen. No gallstones, gallbladder wall thickening, or biliary dilatation. Pancreas: Unremarkable. No pancreatic ductal dilatation or surrounding inflammatory changes. Spleen: Normal in size without focal abnormality. Adrenals/Urinary Tract: No urinary tract calculi or obstructive uropathy within either kidney. Kidneys enhance normally. The adrenals and bladder are unremarkable. Stomach/Bowel: No bowel obstruction or ileus. Normal appendix right lower quadrant. No bowel wall thickening or inflammatory change. Lymphatic: No pathologic adenopathy. Reproductive: Prostate is unremarkable. Other: No free fluid or free intraperitoneal gas. No abdominal wall hernia. Musculoskeletal: No acute or destructive bony abnormalities. Reconstructed images demonstrate no additional findings. Review of the MIP images confirms the above findings. IMPRESSION: 1. No evidence of thoracoabdominal aortic aneurysm or dissection. 2. No evidence of pulmonary embolus. 3. No acute intrathoracic, intra-abdominal, or intrapelvic process. Electronically Signed   By: Ozell Daring M.D.   On: 11/03/2023 17:21   DG Chest 2 View Result Date: 11/03/2023 EXAM: 2 VIEW(S) XRAY OF THE CHEST 11/03/2023 11:48:00 AM COMPARISON: 1 view chest x-ray 03/09/2023. CLINICAL HISTORY: Chest tightness,  shortness of breath, and lower back pain onset yesterday while at work. Symptoms have persisted since and now patient feels generalized fatigue. FINDINGS: LUNGS AND PLEURA: Clear lungs. No pneumothorax or pleural effusion. HEART AND MEDIASTINUM: No acute abnormality of the cardiac and mediastinal silhouettes. BONES AND SOFT TISSUES: No acute osseous abnormality. IMPRESSION: 1. No acute process. Electronically signed by: Lonni Necessary MD 11/03/2023 12:19 PM EDT RP Workstation: [id number]    Microbiology: Results for orders placed or performed during the hospital encounter of 11/10/19  Group A Strep by PCR     Status: None   Collection Time: 11/10/19  5:00 PM   Specimen: Throat; Sterile Swab  Result Value Ref Range Status   Group A Strep by PCR NOT DETECTED NOT DETECTED Final    Comment: Performed at [facility], [address] [place], [place] [address]    Labs: CBC: Recent Labs  Lab 11/03/23 1138 11/04/23 0227 11/05/23 0352  WBC 7.2 7.6 6.7  HGB 13.2 13.3 13.0  HCT 39.5 40.0 40.0  MCV 80.8 81.1 81.8  PLT 288 306 269   Basic Metabolic Panel: Recent Labs  Lab 11/03/23 1138 11/04/23 0227 11/05/23 0352  NA 133* 134* 134*  K 3.7 3.5 3.4*  CL 95* 98 101  CO2 23 26 24   GLUCOSE 349* 306* 283*  BUN 9 8 13   CREATININE 1.00 0.79 0.97  CALCIUM  9.5 9.1 9.1  PHOS  --   --  4.3   Liver Function Tests: Recent Labs  Lab 11/04/23 0227 11/05/23 0352  AST 20  --   ALT 31  --   ALKPHOS 93  --   BILITOT 0.6  --   PROT 7.4  --   ALBUMIN 3.6 3.3*   CBG: Recent Labs  Lab 11/04/23 1713 11/04/23 2104 11/05/23 0626 11/05/23 1138 11/05/23 1340  GLUCAP 320* 239* 329* 226* 237*    Discharge time spent: less than 30 minutes.  Signed: Nydia Distance, MD [facility] 11/05/2023

## 2023-11-06 ENCOUNTER — Telehealth: Payer: Self-pay | Admitting: *Deleted

## 2023-11-06 LAB — LIPOPROTEIN A (LPA): Lipoprotein (a): 11 nmol/L (ref <75.0)

## 2023-11-06 NOTE — Progress Notes (Signed)
 Cardiology Office Note   Date:  11/20/2023  ID:  Ricky [name], DOB 10/15/1990, MRN [id number] PCP: Colette Torrence GRADE, MD  [facility] Providers Cardiologist:  Lonni LITTIE Nanas, MD    History of Present Illness [name] is a 33 y.o. male with a past medical history of HTN, uncontrolled type 2 DM, family history of premature CAD. Patient presents today for a hospital follow up appointment   Patient was recently admitted from 9/13-9/15 after he presented with chest pain. Underwent echocardiogram on 9/14 that showed EF 60-65%, no regional wall motion abnormalities, moderate LVH, normal RV systolic function, no significant valvular abnormalities. Due to concern of unstable angina and patient's strong family history of premature CAD, patient was set up for cardiac catheterization. Underwent cath 11/05/23 that showed slow flow in the coronary vessels suggestive of microvascular dysfunction, moderate disease in the mid-distal LAD and mild disease in the distal RCA. Recommended medical management   Patient was seen in the ED on 9/22 with chest pain.  Reports that he had been at work when he started to have some chest discomfort.  He went home and chest pain persisted.  Went to the ED for evaluation.  There, high-sensitivity troponin negative x 2. EKG showed normal sinus rhythm, he reviewed inversions in lead III, aVF.  EKG unchanged when compared to EKG from 11/03/2023.   Today, patient reports that he has had 2 episodes of chest pain since his cardiac catheterization.  The first occurred on 9/22 when he went to the ED.  Yesterday evening he went to a [employer] fight and when walking up the stairs to his seat he also had some chest pain.  He took 2 nitroglycerin  and this pain resolved.  He has been trying to increase his physical activity otherwise by walking around more.  He usually tolerates walking on flat ground okay.  If he exerts himself a little bit more, he will get some chest  pain.  When he gets chest pain/tightness, he often burps.  Patient denies dyspnea on exertion.  However, he will have random episodes where he feels like he is breathing harder than usual.  This does not seem to be associated with exertion.  He denies dizziness, syncope, near syncope.  No palpitations.  He has been tolerating his lisinopril  well.  Multiple of his family members had reactions to lisinopril  so he is interested in transitioning off of it to a different medication.  Patient also reports that he has been having quite a bit of fatigue recently.  He often feels fatigued during the day.  His fiance has noted that he snores.    Studies Reviewed Cardiac Studies & Procedures   ______________________________________________________________________________________________ CARDIAC CATHETERIZATION  CARDIAC CATHETERIZATION 11/05/2023  Conclusion Images from the original result were not included. Cardiac Catheterization 11/05/23: Hemodynamic data: LV 127/6, EDP 15 mmHg.  Ao 117/81, mean 99 mmHg.  No pressure gradient across the aortic valve.  Angiographic data:  Slow flow noted in the coronary vessels suggestive of microvascular dysfunction.  LM: Large-caliber vessel.  Has a superior takeoff and best engaged with AL 2.  Smooth and normal. LAD: Gives origin to small to moderate-sized D1 and a very large D2.  LAD ends at the apex.  Mid to distal LAD has mild to moderate diffuse disease and a focal 30% stenosis in the midsegment. LCx: Large-caliber vessel, gives origin to large OM 3.  Mid to distal Cx has 30% stenosis. RI: Moderate caliber vessel, smooth and normal. RCA: Large-caliber  vessel.  Has mild disease in the distal segment, large PDA and small PL branch.    Impression and recommendations: Slow flow noted in the coronary vessels suggestive of microvascular dysfunction.  Moderate disease especially in the mid to distal LAD and mild disease in the distal RCA.  Recommend aggressive  risk modification.  Findings Coronary Findings Diagnostic  Dominance: Right  Left Anterior Descending There is mild diffuse disease throughout the vessel. Mid LAD lesion is 30% stenosed.  Left Circumflex Mid Cx to Dist Cx lesion is 30% stenosed.  Right Coronary Artery Dist RCA lesion is 20% stenosed.  Intervention  No interventions have been documented.     ECHOCARDIOGRAM  ECHOCARDIOGRAM COMPLETE 11/04/2023  Narrative ECHOCARDIOGRAM REPORT    Patient Name:   Ricky [name] Date of Exam: 11/04/2023 Medical Rec #:  [id number]          Height:       71.5 in Accession #:    [id number]         Weight:       241.4 lb Date of Birth:  02/01/1991          BSA:          2.296 m Patient Age:    32 years           BP:           119/77 mmHg Patient Gender: M                  HR:           81 bpm. Exam Location:  Inpatient  Procedure: 2D Echo, Cardiac Doppler and Color Doppler (Both Spectral and Color Flow Doppler were utilized during procedure).  Indications:    NSTEMI I21.4  History:        Patient has no prior history of Echocardiogram examinations. Angina, Signs/Symptoms:Chest Pain; Risk Factors:Hypertension, Diabetes and Dyslipidemia.  Sonographer:    Thea Norlander RCS Referring Phys: SUBRINA SUNDIL  IMPRESSIONS   1. Left ventricular ejection fraction, by estimation, is 60 to 65%. The left ventricle has normal function. The left ventricle has no regional wall motion abnormalities. There is moderate left ventricular hypertrophy. Left ventricular diastolic parameters were normal. 2. Right ventricular systolic function is normal. The right ventricular size is normal. 3. The mitral valve is normal in structure. Trivial mitral valve regurgitation. No evidence of mitral stenosis. 4. The aortic valve is tricuspid. Aortic valve regurgitation is not visualized. No aortic stenosis is present.  FINDINGS Left Ventricle: Left ventricular ejection fraction, by estimation, is  60 to 65%. The left ventricle has normal function. The left ventricle has no regional wall motion abnormalities. The left ventricular internal cavity size was small. There is moderate left ventricular hypertrophy. Left ventricular diastolic parameters were normal.  Right Ventricle: The right ventricular size is normal. No increase in right ventricular wall thickness. Right ventricular systolic function is normal.  Left Atrium: Left atrial size was normal in size.  Right Atrium: Right atrial size was normal in size.  Pericardium: Trivial pericardial effusion is present.  Mitral Valve: The mitral valve is normal in structure. Trivial mitral valve regurgitation. No evidence of mitral valve stenosis.  Tricuspid Valve: The tricuspid valve is normal in structure. Tricuspid valve regurgitation is trivial.  Aortic Valve: The aortic valve is tricuspid. Aortic valve regurgitation is not visualized. No aortic stenosis is present. Aortic valve peak gradient measures 4.5 mmHg.  Pulmonic Valve: The pulmonic valve was not well visualized. Pulmonic valve  regurgitation is not visualized.  Aorta: The aortic root and ascending aorta are structurally normal, with no evidence of dilitation.  IAS/Shunts: The interatrial septum was not well visualized.   LEFT VENTRICLE PLAX 2D LVIDd:         3.30 cm   Diastology LVIDs:         2.10 cm   LV e' medial:    7.62 cm/s LV PW:         1.20 cm   LV E/e' medial:  9.3 LV IVS:        1.20 cm   LV e' lateral:   12.10 cm/s LVOT diam:     2.60 cm   LV E/e' lateral: 5.8 LV SV:         78 LV SV Index:   34 LVOT Area:     5.31 cm   RIGHT VENTRICLE RV S prime:     17.40 cm/s TAPSE (M-mode): 1.9 cm  LEFT ATRIUM           Index        RIGHT ATRIUM           Index LA diam:      4.10 cm 1.79 cm/m   RA Area:     12.70 cm LA Vol (A2C): 32.4 ml 14.11 ml/m  RA Volume:   26.70 ml  11.63 ml/m LA Vol (A4C): 46.3 ml 20.17 ml/m AORTIC VALVE AV Area (Vmax): 4.25 cm AV  Vmax:        106.00 cm/s AV Peak Grad:   4.5 mmHg LVOT Vmax:      84.90 cm/s LVOT Vmean:     56.600 cm/s LVOT VTI:       0.147 m  AORTA Ao Root diam: 3.40 cm Ao Asc diam:  3.10 cm  MITRAL VALVE MV Area (PHT): 3.77 cm    SHUNTS MV Decel Time: 201 msec    Systemic VTI:  0.15 m MV E velocity: 70.70 cm/s  Systemic Diam: 2.60 cm MV A velocity: 51.90 cm/s MV E/A ratio:  1.36  Lonni Nanas MD Electronically signed by Lonni Nanas MD Signature Date/Time: 11/04/2023/12:37:18 PM    Final          ______________________________________________________________________________________________      Risk Assessment/Calculations       STOP-Bang Score:  4      Physical Exam VS:  BP 106/76   Pulse 98   Ht 5' 11.2 (1.808 m)   Wt 247 lb 3.2 oz (112.1 kg)   SpO2 99%   BMI 34.28 kg/m        Wt Readings from Last 3 Encounters:  11/20/23 247 lb 3.2 oz (112.1 kg)  11/12/23 242 lb 15.2 oz (110.2 kg)  11/05/23 242 lb 14.4 oz (110.2 kg)    GEN: Well nourished, well developed in no acute distress.  Sitting comfortably on the exam table NECK: No JVD  CARDIAC:  RRR, no murmurs, rubs, gallops.  Radial pulses 2+ bilaterally.  Right radial cath site soft, nontender RESPIRATORY:  Clear to auscultation without rales, wheezing or rhonchi  ABDOMEN: Soft, non-tender, non-distended EXTREMITIES:  No edema in bilateral lower extremities; No deformity   ASSESSMENT AND PLAN  Nonobstructive CAD  - Patient recently admitted with chest pain concerning for unstable angina. Cath on 9/15 showed slow flow in the coronary vessels suggestive of microvascular dysfunction, moderate disease in the mid-distal LAD and mild disease in the distal RCA - Since his cath, patient has continued to have  episodes of chest pain.  Chest pain seems to occur with exertion.  Notes that he often burps when he is having chest discomfort.  Relieved by nitroglycerin  -Discussed that patient may be having chest  pain due to his microvascular disease which was noted on cardiac catheterization.  BP 106/76. Stop lisinopril , start Imdur 30 mg daily -As chest pain is often associated with burping, also discussed possible GI etiology.  Continue Protonix  - Arranged close follow-up to ensure that chest pain improves with Imdur - Continue ASA 81 mg daily  - Continue crestor  20 mg daily and fenofibrate  160 mg daily   HTN  - BP well controlled - no symptoms of orthostatic hypotension  - As above, stop lisinopril  and start Imdur 30 mg daily for antianginal effect  HLD  - Lipid panel 10/2023 with LDL 195, HDL 39, triglycerides 717, total cholesterol 324  - Continue crestor  20 mg daily (new during recent admission)  - Continue fenofibrate  160 mg daily  - Need to repeat lipids, LFTs in the next 1-2 months  Sleep disordered breathing -Patient reports frequently feeling fatigued during the day.  He does snore at night.  STOP-BANG 4 -Ordered sleep study   Type 2 DM - A1c 12.4 during admission in 10/2023  - On insulin , jardiance , metformin .  followed by PCP  Dispo: Follow-up in 4 to 6 weeks  Signed, Rollo FABIENE Louder, PA-C

## 2023-11-06 NOTE — Transitions of Care (Post Inpatient/ED Visit) (Signed)
   11/06/2023  Name: Ricky [name] MRN: [id number] DOB: 1991/01/20  Today's TOC FU Call Status: Today's TOC FU Call Status:: Unsuccessful Call (1st Attempt) Unsuccessful Call (1st Attempt) Date: 11/06/23  Attempted to reach the patient regarding the most recent Inpatient/ED visit.  Follow Up Plan: Additional outreach attempts will be made to reach the patient to complete the Transitions of Care (Post Inpatient/ED visit) call.   Andrea Dimes RN, BSN [place]  [facility] RN Care Manager [phone]

## 2023-11-07 ENCOUNTER — Ambulatory Visit (HOSPITAL_COMMUNITY): Admitting: Licensed Clinical Social Worker

## 2023-11-07 ENCOUNTER — Telehealth: Payer: Self-pay | Admitting: *Deleted

## 2023-11-07 NOTE — Transitions of Care (Post Inpatient/ED Visit) (Signed)
   11/07/2023  Name: Kino Dunsworth MRN: [id number] DOB: 1991-02-15  Today's TOC FU Call Status: Today's TOC FU Call Status:: Unsuccessful Call (2nd Attempt) Unsuccessful Call (2nd Attempt) Date: 11/07/23  Attempted to reach the patient regarding the most recent Inpatient/ED visit.  Follow Up Plan: Additional outreach attempts will be made to reach the patient to complete the Transitions of Care (Post Inpatient/ED visit) call.   Andrea Dimes RN, BSN [place]  [facility] RN Care Manager [phone]

## 2023-11-08 ENCOUNTER — Telehealth: Payer: Self-pay | Admitting: *Deleted

## 2023-11-08 NOTE — Transitions of Care (Post Inpatient/ED Visit) (Signed)
 11/08/2023  Name: Ricky [name] MRN: [id number] DOB: 04-09-1990  Today's TOC FU Call Status: Today's TOC FU Call Status:: Successful TOC FU Call Completed TOC FU Call Complete Date: 11/08/23 Patient's Name and Date of Birth confirmed.  Transition Care Management Follow-up Telephone Call Date of Discharge: 11/05/23 Discharge Facility: [facility]) Type of Discharge: Inpatient Admission Primary Inpatient Discharge Diagnosis:: chest pain How have you been since you were released from the hospital?: Better Any questions or concerns?: No  Items Reviewed: Did you receive and understand the discharge instructions provided?: Yes Medications obtained,verified, and reconciled?: Yes (Medications Reviewed) Any new allergies since your discharge?: No Dietary orders reviewed?: Yes Type of Diet Ordered:: carb modified Do you have support at home?: Yes People in Home [RPT]: significant other, parent(s) Name of Support/Comfort Primary Source: Hannah/[place], Mother and Grandmother  Medications Reviewed Today: Medications Reviewed Today     Reviewed by Lucky Andrea LABOR, RN (Registered Nurse) on 11/08/23 at [phone]  Med List Status: <None>   Medication Order Taking? Sig Documenting Provider Last Dose Status Informant  aspirin  EC 81 MG tablet [id number] Yes Take 1 tablet (81 mg total) by mouth daily. Swallow whole. Davia Nydia POUR, MD  Active   Continuous Glucose Receiver WILMOT G7 Level Green) [place] [id number]  Use for checking sugars  Patient not taking: Reported on 11/08/2023   Colette Torrence GRADE, MD  Active Self  Continuous Glucose Sensor [facility] G7 SENSOR) [place] [id number]  Check sugars daily  Patient not taking: Reported on 11/08/2023   Colette Torrence GRADE, MD  Active Self  empagliflozin  (JARDIANCE ) 25 MG TABS tablet [id number] Yes Take 1 tablet (25 mg total) by mouth daily. Colette Torrence GRADE, MD  Active Self  fenofibrate  160 MG tablet [id number] Yes Take 1 tablet (160 mg total) by mouth daily. Rai,  Nydia POUR, MD  Active   hydrOXYzine  (ATARAX ) 10 MG tablet [id number] Yes Take 1 tablet (10 mg total) by mouth at bedtime as needed.  Patient taking differently: Take 10 mg by mouth at bedtime as needed for anxiety.   Ezzard Staci SAILOR, NP  Active Self  insulin  glargine (LANTUS ) 100 UNIT/ML injection [id number] Yes Inject 0.4 mLs (40 Units total) into the skin daily. Colette Torrence GRADE, MD  Active Self  lamoTRIgine  (LAMICTAL ) 25 MG tablet [id number] Yes Take 1 tablet (25 mg total) by mouth daily for 7 days, THEN 2 tablets (50 mg total) daily.  Patient taking differently: Taking 75mg  daily   Ezzard Staci SAILOR, NP  Active Self           Med Note (S[place], TEENA BRAVO   Sat Nov 03, 2023 10:37 PM) Patient verified he is taking twice daily now per patient  lisinopril  (ZESTRIL ) 5 MG tablet [id number] Yes Take 1 tablet (5 mg total) by mouth daily. Rai, Nydia POUR, MD  Active   metFORMIN  (GLUCOPHAGE -XR) 500 MG 24 hr tablet [id number] Yes TAKE 2 TABLETS (1,000 MG TOTAL) BY MOUTH 2 (TWO) TIMES DAILY WITH A MEAL. Colette Torrence GRADE, MD  Active Self  nitroGLYCERIN  (NITROSTAT ) 0.4 MG SL tablet [id number] Yes Place 1 tablet (0.4 mg total) under the tongue every 5 (five) minutes as needed for chest pain. Rai, Nydia POUR, MD  Active   pantoprazole  (PROTONIX ) 40 MG tablet [id number] Yes Take 1 tablet (40 mg total) by mouth daily. Rai, Nydia POUR, MD  Active   rosuvastatin  (CRESTOR ) 20 MG tablet [id number] Yes Take 1 tablet (20 mg total) by mouth daily. Davia Nydia POUR, MD  Active  Med List Note Giacomo Camelia BIRCH, CPhT 02/20/17 2321): No meds            Home Care and Equipment/Supplies: Were Home Health Services Ordered?: No Any new equipment or medical supplies ordered?: No  Functional Questionnaire: Do you need assistance with bathing/showering or dressing?: No Do you need assistance with meal preparation?: No Do you need assistance with eating?: No Do you have difficulty maintaining continence: No Do you  need assistance with getting out of bed/getting out of a chair/moving?: No Do you have difficulty managing or taking your medications?: No  Follow up appointments reviewed: PCP Follow-up appointment confirmed?: Yes Date of PCP follow-up appointment?: 11/14/23 Follow-up Provider: Darice Brownie Specialist [facility] Follow-up appointment confirmed?: Yes Date of Specialist follow-up appointment?: 11/20/23 Follow-Up Specialty Provider:: Cardiology Do you need transportation to your follow-up appointment?: No Do you understand care options if your condition(s) worsen?: Yes-patient verbalized understanding  SDOH Interventions Today    Flowsheet Row Most Recent Value  SDOH Interventions   Food Insecurity Interventions Intervention Not Indicated  Housing Interventions Intervention Not Indicated  Transportation Interventions Intervention Not Indicated  Utilities Interventions Intervention Not Indicated    Goals Addressed             This Visit's Progress    VBCI Transitions of Care (TOC) Care Plan       Problems:  Recent Hospitalization for treatment of Chest Pain with uncontrolled DM Knowledge Deficit Related to Chest Pain and DM management  Goal:  Over the next 30 days, the patient will not experience hospital readmission  Interventions:  Transitions of Care: Doctor Visits  - discussed the importance of doctor visits Post discharge activity limitations prescribed by provider reviewed Reviewed Signs and symptoms of infection Medications reviewed, advised patient to take all medications as instructed, should have 4 refills for Dexcom sensor at Pharmacy  Diabetes Interventions: Assessed patient's understanding of A1c goal: <7% Provided education to patient about basic DM disease process Reviewed medications with patient and discussed importance of medication adherence Reviewed scheduled/upcoming provider appointments including: PCP on 11/14/23 and Cardiology on 11/20/23 Review of  patient status, including review of consultants reports, relevant laboratory and other test results, and medications completed Assessed social determinant of health barriers Advised patient to schedule a follow up visit with Endocrinology-last OV 12/2023 Reviewed diet, exercise and foot care Advised patient to check BS before meals and at bedtime and take a log to all provider visits Lab Results  Component Value Date   HGBA1C 12.4 (H) 11/03/2023    Patient Self Care Activities:  Attend all scheduled provider appointments Call pharmacy for medication refills 3-7 days in advance of running out of medications Call provider office for new concerns or questions  Notify RN Care Manager of [facility] call rescheduling needs Participate in Transition of Care Program/Attend TOC scheduled calls Take medications as prescribed   drink 6 to 8 glasses of water  each day fill half of plate with vegetables manage portion size wash and dry feet carefully every day  Plan:  Telephone follow up appointment with care management team member scheduled for:  11/15/23 at 9:30am         Andrea Dimes RN, BSN [place]  [facility] RN Care Manager [phone]

## 2023-11-12 ENCOUNTER — Encounter (HOSPITAL_COMMUNITY): Payer: Self-pay | Admitting: Emergency Medicine

## 2023-11-12 ENCOUNTER — Emergency Department (HOSPITAL_COMMUNITY)

## 2023-11-12 ENCOUNTER — Emergency Department (HOSPITAL_COMMUNITY)
Admission: EM | Admit: 2023-11-12 | Discharge: 2023-11-13 | Disposition: A | Discharge status: Home / Self Care | Attending: Emergency Medicine | Admitting: Emergency Medicine

## 2023-11-12 ENCOUNTER — Other Ambulatory Visit: Payer: Self-pay

## 2023-11-12 DIAGNOSIS — E1165 Type 2 diabetes mellitus with hyperglycemia: Secondary | ICD-10-CM | POA: Insufficient documentation

## 2023-11-12 DIAGNOSIS — Z7982 Long term (current) use of aspirin: Secondary | ICD-10-CM | POA: Diagnosis not present

## 2023-11-12 DIAGNOSIS — J45909 Unspecified asthma, uncomplicated: Secondary | ICD-10-CM | POA: Diagnosis not present

## 2023-11-12 DIAGNOSIS — Z794 Long term (current) use of insulin: Secondary | ICD-10-CM | POA: Diagnosis not present

## 2023-11-12 DIAGNOSIS — R0789 Other chest pain: Secondary | ICD-10-CM | POA: Diagnosis present

## 2023-11-12 DIAGNOSIS — E871 Hypo-osmolality and hyponatremia: Secondary | ICD-10-CM | POA: Diagnosis not present

## 2023-11-12 DIAGNOSIS — Z7984 Long term (current) use of oral hypoglycemic drugs: Secondary | ICD-10-CM | POA: Diagnosis not present

## 2023-11-12 DIAGNOSIS — R739 Hyperglycemia, unspecified: Secondary | ICD-10-CM

## 2023-11-12 LAB — BASIC METABOLIC PANEL WITH GFR
Anion gap: 12 (ref 5–15)
BUN: 18 mg/dL (ref 6–20)
CO2: 23 mmol/L (ref 22–32)
Calcium: 9.5 mg/dL (ref 8.9–10.3)
Chloride: 96 mmol/L — ABNORMAL LOW (ref 98–111)
Creatinine, Ser: 1.18 mg/dL (ref 0.61–1.24)
GFR, Estimated: >60 mL/min (ref >60)
Glucose, Bld: 390 mg/dL — ABNORMAL HIGH (ref 70–99)
Potassium: 4.5 mmol/L (ref 3.5–5.1)
Sodium: 131 mmol/L — ABNORMAL LOW (ref 135–145)

## 2023-11-12 LAB — CBC
HCT: 38.5 % — ABNORMAL LOW (ref 39.0–52.0)
Hemoglobin: 12.5 g/dL — ABNORMAL LOW (ref 13.0–17.0)
MCH: 26.7 pg (ref 26.0–34.0)
MCHC: 32.5 g/dL (ref 30.0–36.0)
MCV: 82.3 fL (ref 80.0–100.0)
Platelets: 310 K/uL (ref 150–400)
RBC: 4.68 MIL/uL (ref 4.22–5.81)
RDW: 13.1 % (ref 11.5–15.5)
WBC: 7.6 K/uL (ref 4.0–10.5)
nRBC: 0 % (ref 0.0–0.2)

## 2023-11-12 LAB — TROPONIN I (HIGH SENSITIVITY): Troponin I (High Sensitivity): 5 ng/L (ref <18)

## 2023-11-12 NOTE — ED Triage Notes (Signed)
 Patient recently discharged after having heart cath, reports having chest pain and sob that started today.  Patient reports my chest feels hollow.

## 2023-11-13 LAB — TROPONIN I (HIGH SENSITIVITY): Troponin I (High Sensitivity): 5 ng/L (ref <18)

## 2023-11-13 NOTE — Discharge Instructions (Signed)
 You were seen today for ongoing chest pain.  Your workup today is reassuring.  Follow-up with cardiology as an outpatient.  Referral was sent and you should hear from them this week.  Continue your medications for optimization.

## 2023-11-13 NOTE — ED Provider Notes (Signed)
 Ricky [name] EMERGENCY DEPARTMENT AT [facility] Provider Note   CSN: [id number] Arrival date & time: 11/12/23  2139     Patient presents with: Chest Pain and Shortness of Breath   Ricky [name] is a 33 y.o. male.   HPI     This is a 33 year old male with a history of hyperglycemia, hyperlipidemia, who presents with chest discomfort.  Reports onset of symptoms around 7 AM yesterday.  Symptoms throughout the day did not improve with nitroglycerin .  Recently had a cardiac catheterization on 9/15 with diffuse mild disease and slow flow generally.  Medical optimization was recommended by cardiology.  Did not have any diaphoresis or shortness of breath.  Chart reviewed.  Slow flow through coronaries and several vessels with approximately 30% disease.  Medical management and diabetes management recommended.  During hospitalization he also had a CT chest abdomen pelvis to rule out dissection.  No PE noted at that time either.  Prior to Admission medications   Medication Sig Start Date End Date Taking? Authorizing Provider  aspirin  EC 81 MG tablet Take 1 tablet (81 mg total) by mouth daily. Swallow whole. 11/06/23   Rai, Nydia POUR, MD  Continuous Glucose Receiver (DEXCOM G7 RECEIVER) DEVI Use for checking sugars Patient not taking: Reported on 11/08/2023 07/25/23   Colette Torrence GRADE, MD  Continuous Glucose Sensor (DEXCOM G7 SENSOR) MISC Check sugars daily Patient not taking: Reported on 11/08/2023 07/25/23   Colette Torrence GRADE, MD  empagliflozin  (JARDIANCE ) 25 MG TABS tablet Take 1 tablet (25 mg total) by mouth daily. 07/12/23   Colette Torrence GRADE, MD  fenofibrate  160 MG tablet Take 1 tablet (160 mg total) by mouth daily. 11/06/23   Rai, Nydia POUR, MD  hydrOXYzine  (ATARAX ) 10 MG tablet Take 1 tablet (10 mg total) by mouth at bedtime as needed. Patient taking differently: Take 10 mg by mouth at bedtime as needed for anxiety. 10/10/23   Ezzard Staci SAILOR, NP  insulin  glargine (LANTUS ) 100  UNIT/ML injection Inject 0.4 mLs (40 Units total) into the skin daily. 07/25/23 01/21/24  Colette Torrence GRADE, MD  lamoTRIgine  (LAMICTAL ) 25 MG tablet Take 1 tablet (25 mg total) by mouth daily for 7 days, THEN 2 tablets (50 mg total) daily. Patient taking differently: Taking 75mg  daily 10/10/23 11/16/23  Ezzard Staci SAILOR, NP  lisinopril  (ZESTRIL ) 5 MG tablet Take 1 tablet (5 mg total) by mouth daily. 11/06/23   Rai, Ripudeep K, MD  metFORMIN  (GLUCOPHAGE -XR) 500 MG 24 hr tablet TAKE 2 TABLETS (1,000 MG TOTAL) BY MOUTH 2 (TWO) TIMES DAILY WITH A MEAL. 08/16/23   Colette Torrence GRADE, MD  nitroGLYCERIN  (NITROSTAT ) 0.4 MG SL tablet Place 1 tablet (0.4 mg total) under the tongue every 5 (five) minutes as needed for chest pain. 11/05/23   Rai, Nydia POUR, MD  pantoprazole  (PROTONIX ) 40 MG tablet Take 1 tablet (40 mg total) by mouth daily. 11/05/23   Rai, Nydia POUR, MD  rosuvastatin  (CRESTOR ) 20 MG tablet Take 1 tablet (20 mg total) by mouth daily. 11/06/23   Davia Nydia POUR, MD    Allergies: Patient has no known allergies.    Review of Systems  Constitutional:  Negative for fever.  Respiratory:  Negative for shortness of breath.   Cardiovascular:  Positive for chest pain.  All other systems reviewed and are negative.   Updated Vital Signs BP 131/79 (BP Location: Right Arm)   Pulse 93   Temp 98.3 F (36.8 C)   Resp 17   Wt  110.2 kg   SpO2 99%   BMI 33.41 kg/m   Physical Exam Vitals and nursing note reviewed.  Constitutional:      Appearance: He is well-developed. He is not ill-appearing.  HENT:     Head: Normocephalic and atraumatic.  Eyes:     Pupils: Pupils are equal, round, and reactive to light.  Cardiovascular:     Rate and Rhythm: Normal rate and regular rhythm.     Heart sounds: Normal heart sounds. No murmur heard. Pulmonary:     Effort: Pulmonary effort is normal. No respiratory distress.     Breath sounds: Normal breath sounds. No wheezing.  Abdominal:     General: Bowel sounds are  normal.     Palpations: Abdomen is soft.     Tenderness: There is no abdominal tenderness. There is no rebound.  Musculoskeletal:     Cervical back: Neck supple.  Lymphadenopathy:     Cervical: No cervical adenopathy.  Skin:    General: Skin is warm and dry.  Neurological:     Mental Status: He is alert and oriented to person, place, and time.     (all labs ordered are listed, but only abnormal results are displayed) Labs Reviewed  BASIC METABOLIC PANEL WITH GFR - Abnormal; Notable for the following components:      Result Value   Sodium 131 (*)    Chloride 96 (*)    Glucose, Bld 390 (*)    All other components within normal limits  CBC - Abnormal; Notable for the following components:   Hemoglobin 12.5 (*)    HCT 38.5 (*)    All other components within normal limits  TROPONIN I (HIGH SENSITIVITY)  TROPONIN I (HIGH SENSITIVITY)    EKG: EKG Interpretation Date/Time:  Monday November 12 2023 22:12:07 EDT Ventricular Rate:  99 PR Interval:  162 QRS Duration:  88 QT Interval:  318 QTC Calculation: 408 R Axis:   68  Text Interpretation: Normal sinus rhythm Anterior infarct , age undetermined T wave abnormality, consider inferior ischemia Abnormal ECG When compared with ECG of 04-Nov-2023 04:38, PREVIOUS ECG IS PRESENT inverted t waves new when compared to prior Confirmed by Bari Pfeiffer ([id number]) on 11/13/2023 2:02:01 AM  Radiology: DG Chest 2 View Result Date: 11/12/2023 CLINICAL DATA:  Chest pain EXAM: CHEST - 2 VIEW COMPARISON:  11/03/2023 FINDINGS: Shallow inspiration. Heart size and pulmonary vascularity are normal. Lungs are clear. No pleural effusions or pneumothorax. Mediastinal contours appear intact. IMPRESSION: No active cardiopulmonary disease. Electronically Signed   By: Elsie Gravely M.D.   On: 11/12/2023 22:31     Procedures   Medications Ordered in the ED - No data to display                                  Medical Decision Making Amount and/or  Complexity of Data Reviewed Labs: ordered. Radiology: ordered.   This patient presents to the ED for concern of chest pain, this involves an extensive number of treatment options, and is a complaint that carries with it a high risk of complications and morbidity.  I considered the following differential and admission for this acute, potentially life threatening condition.  The differential diagnosis includes ACS, PE, pneumothorax, pneumonia, chest wall pain  MDM:    This is a 33 year old male who presents with chest discomfort.  Nontoxic and vital signs are reassuring.  Recent cardiac catheterization with medical management  is recommended.  He does not have any evidence of acute ischemia or arrhythmia on EKG.  Chest x-ray without pneumothorax or pneumonia.  Chart reviewed.  Medical management recommended.  Troponin x 2 negative.  Patient was referred for cardiology follow-up.  Feel he can follow-up safely.  Recommend that he continue close diabetes control.  Glucose here is 390 without signs of DKA.  (Labs, imaging, consults)  Labs: I Ordered, and personally interpreted labs.  The pertinent results include: CBC, BMP, troponin x 2  Imaging Studies ordered: I ordered imaging studies including chest x-ray I independently visualized and interpreted imaging. I agree with the radiologist interpretation  Additional history obtained from chart review.  External records from outside source obtained and reviewed including cardiology notes  Cardiac Monitoring: The patient was not maintained on a cardiac monitor.  If on the cardiac monitor, I personally viewed and interpreted the cardiac monitored which showed an underlying rhythm of: N/A  Reevaluation: After the interventions noted above, I reevaluated the patient and found that they have :improved  Social Determinants of Health:  lives independently  Disposition: Discharge  Co morbidities that complicate the patient evaluation  Past Medical  History:  Diagnosis Date   Asthma    Diabetes mellitus without complication (HCC)    Mood disorder      Medicines No orders of the defined types were placed in this encounter.   I have reviewed the patients home medicines and have made adjustments as needed  Problem List / ED Course: Problem List Items Addressed This Visit   None Visit Diagnoses       Atypical chest pain    -  Primary   Relevant Orders   Ambulatory referral to Cardiology     Hyperglycemia                    Final diagnoses:  Atypical chest pain    ED Discharge Orders          Ordered    Ambulatory referral to Cardiology        11/13/23 0237               Bari Charmaine FALCON, MD 11/13/23 [phone]

## 2023-11-13 NOTE — ED Notes (Signed)
 Discharge instructions reviewed.   Opportunity for questions and concerns provided.   Alert, oriented and ambulatory.   Displays no signs of distress.   Encouraged to follow up with Cardiology as indicated.

## 2023-11-14 ENCOUNTER — Ambulatory Visit: Admitting: Family Medicine

## 2023-11-14 ENCOUNTER — Telehealth: Payer: Self-pay

## 2023-11-14 ENCOUNTER — Inpatient Hospital Stay: Admitting: Family Medicine

## 2023-11-14 NOTE — Telephone Encounter (Signed)
 Noted and Aware  Copied from CRM [id number]. Topic: Appointments - Scheduling Inquiry for Clinic >> Nov 14, 2023  9:23 AM Alfonso ORN wrote: Reason for CRM: pt requesting to reschedule the hospital followup appointment that was for 11/14/23 had to cancel due to family emergency and request for a wednesday due to his work schedule.    +Hospital follow up+Return in about 3 months (around 10/12/2023) for Diabetes. appointment rescheduled due to provider being out on leave 10/18/2023 tvt

## 2023-11-15 ENCOUNTER — Encounter: Payer: Self-pay | Admitting: *Deleted

## 2023-11-15 ENCOUNTER — Telehealth: Payer: Self-pay | Admitting: *Deleted

## 2023-11-16 ENCOUNTER — Encounter: Payer: Self-pay | Admitting: *Deleted

## 2023-11-19 ENCOUNTER — Encounter: Payer: Self-pay | Admitting: *Deleted

## 2023-11-20 ENCOUNTER — Encounter: Payer: Self-pay | Admitting: Cardiology

## 2023-11-20 ENCOUNTER — Ambulatory Visit: Attending: Cardiology | Admitting: Cardiology

## 2023-11-20 VITALS — BP 106/76 | HR 98 | Ht 71.2 in | Wt 247.2 lb

## 2023-11-20 DIAGNOSIS — G4730 Sleep apnea, unspecified: Secondary | ICD-10-CM | POA: Diagnosis not present

## 2023-11-20 DIAGNOSIS — E1165 Type 2 diabetes mellitus with hyperglycemia: Secondary | ICD-10-CM | POA: Diagnosis not present

## 2023-11-20 DIAGNOSIS — E782 Mixed hyperlipidemia: Secondary | ICD-10-CM

## 2023-11-20 DIAGNOSIS — Z794 Long term (current) use of insulin: Secondary | ICD-10-CM

## 2023-11-20 DIAGNOSIS — I10 Essential (primary) hypertension: Secondary | ICD-10-CM | POA: Diagnosis not present

## 2023-11-20 DIAGNOSIS — I25118 Atherosclerotic heart disease of native coronary artery with other forms of angina pectoris: Secondary | ICD-10-CM

## 2023-11-20 MED ORDER — ISOSORBIDE MONONITRATE ER 30 MG PO TB24: 30.0000 mg | ORAL_TABLET | Freq: Every day | ORAL | 3 refills | Status: DC

## 2023-11-20 NOTE — Patient Instructions (Signed)
 Medication Instructions:  Stop Lisinopril   Start Imdur 30 mg take one tablet daily *If you need a refill on your cardiac medications before your next appointment, please call your pharmacy*  Lab Work: None ordered If you have labs (blood work) drawn today and your tests are completely normal, you will receive your results only by: MyChart Message (if you have MyChart) OR A paper copy in the mail If you have any lab test that is abnormal or we need to change your treatment, we will call you to review the results.  Testing/Procedures: Your physician has recommended that you have a sleep study. This test records several body functions during sleep, including: brain activity, eye movement, oxygen and carbon dioxide blood levels, heart rate and rhythm, breathing rate and rhythm, the flow of air through your mouth and nose, snoring, body muscle movements, and chest and belly movement.   Follow-Up: At [facility], you and your health needs are our priority.  As part of our continuing mission to provide you with exceptional heart care, our providers are all part of one team.  This team includes your primary Cardiologist (physician) and Advanced Practice Providers or APPs (Physician Assistants and Nurse Practitioners) who all work together to provide you with the care you need, when you need it.  Your next appointment:   4-6week(s)  Provider:   Rollo Louder, PA-C

## 2023-12-10 ENCOUNTER — Telehealth: Payer: Self-pay

## 2023-12-10 NOTE — Telephone Encounter (Signed)
 Prior auth for: [facility] G7 RECEIVER DEVICE Determination: PENDING  Auth #: [id number] Valid from: N/A Reason: N/A

## 2023-12-12 NOTE — Telephone Encounter (Signed)
 The prior authorization for [facility] G7 RECEIVER DEVICE was denied by the insurance. The patient has been made aware of the update via a Mychart msg.

## 2023-12-12 NOTE — Telephone Encounter (Signed)
 Prior auth for: [facility] G7 RECEIVER DEVICE Determination: DENIED Auth #: [id number] Valid from: N/A Reason: [employer] has a denied request on file for DEXCOM G7 RECEIVER for this member.

## 2023-12-19 NOTE — Progress Notes (Signed)
 Cardiology Office Note   Date:  01/02/2024  ID:  Deacon Ricky [name], DOB 12/29/1990, MRN [id number] PCP: Colette Torrence GRADE, MD  [facility] Providers Cardiologist:  Lonni LITTIE Nanas, MD  History of Present Illness Ricky [name] is a 33 y.o. male  with a past medical history of HTN, uncontrolled type 2 DM, family history of premature CAD. Patient presents today for a hospital follow up appointment    Patient was recently admitted from 9/13-9/15 after he presented with chest pain. Underwent echocardiogram on 9/14 that showed EF 60-65%, no regional wall motion abnormalities, moderate LVH, normal RV systolic function, no significant valvular abnormalities. Due to concern of unstable angina and patient's strong family history of premature CAD, patient was set up for cardiac catheterization. Underwent cath 11/05/23 that showed slow flow in the coronary vessels suggestive of microvascular dysfunction, moderate disease in the mid-distal LAD and mild disease in the distal RCA. Recommended medical management    Patient was seen in the ED on 9/22 with chest pain.  Reports that he had been at work when he started to have some chest discomfort.  He went home and chest pain persisted.  Went to the ED for evaluation.  There, high-sensitivity troponin negative x 2. EKG showed normal sinus rhythm, he reviewed inversions in lead III, aVF.  EKG unchanged when compared to EKG from 11/03/2023.   I saw patient in clinic on 9/30.  He continued to have episodes of chest pain with exertion that was relieved by nitroglycerin .  Stopped lisinopril  and instead started Imdur 30 mg daily  Patient was seen in the ED on 11/7.  Presented with chest pain.  In the ED, troponin 20 x 2.  EKG was without ischemic changes.  Overall, pain was somewhat atypical.  He was able to be discharged from the ED with cardiology follow-up.  Today, patient presents for follow-up appointment.  Tells me that he has overall been  doing very well from a cardiac perspective.  He is able to go to the gym and play basketball without having any chest pain or shortness of breath.  On 11/7, his blood pressure was elevated.  He had mild chest discomfort so he decided to be seen in the ED.  He denies having any chest pain besides this episode.  He has been compliant with his medications and is tolerating them well.  Denies dizziness, syncope, near syncope.  Denies palpitations.  He recently established care with an endocrinologist for his diabetes management.   Studies Reviewed     Cardiac Studies & Procedures   ______________________________________________________________________________________________ CARDIAC CATHETERIZATION  CARDIAC CATHETERIZATION 11/05/2023  Conclusion Images from the original result were not included. Cardiac Catheterization 11/05/23: Hemodynamic data: LV 127/6, EDP 15 mmHg.  Ao 117/81, mean 99 mmHg.  No pressure gradient across the aortic valve.  Angiographic data:  Slow flow noted in the coronary vessels suggestive of microvascular dysfunction.  LM: Large-caliber vessel.  Has a superior takeoff and best engaged with AL 2.  Smooth and normal. LAD: Gives origin to small to moderate-sized D1 and a very large D2.  LAD ends at the apex.  Mid to distal LAD has mild to moderate diffuse disease and a focal 30% stenosis in the midsegment. LCx: Large-caliber vessel, gives origin to large OM 3.  Mid to distal Cx has 30% stenosis. RI: Moderate caliber vessel, smooth and normal. RCA: Large-caliber vessel.  Has mild disease in the distal segment, large PDA and small PL branch.    Impression and  recommendations: Slow flow noted in the coronary vessels suggestive of microvascular dysfunction.  Moderate disease especially in the mid to distal LAD and mild disease in the distal RCA.  Recommend aggressive risk modification.  Findings Coronary Findings Diagnostic  Dominance: Right  Left Anterior  Descending There is mild diffuse disease throughout the vessel. Mid LAD lesion is 30% stenosed.  Left Circumflex Mid Cx to Dist Cx lesion is 30% stenosed.  Right Coronary Artery Dist RCA lesion is 20% stenosed.  Intervention  No interventions have been documented.     ECHOCARDIOGRAM  ECHOCARDIOGRAM COMPLETE 11/04/2023  Narrative ECHOCARDIOGRAM REPORT    Patient Name:   Ricky [name] Date of Exam: 11/04/2023 Medical Rec #:  [id number]          Height:       71.5 in Accession #:    [id number]         Weight:       241.4 lb Date of Birth:  [birth year]          BSA:          2.296 m Patient Age:    32 years           BP:           119/77 mmHg Patient Gender: M                  HR:           81 bpm. Exam Location:  Inpatient  Procedure: 2D Echo, Cardiac Doppler and Color Doppler (Both Spectral and Color Flow Doppler were utilized during procedure).  Indications:    NSTEMI I21.4  History:        Patient has no prior history of Echocardiogram examinations. Angina, Signs/Symptoms:Chest Pain; Risk Factors:Hypertension, Diabetes and Dyslipidemia.  Sonographer:    Thea Norlander RCS Referring Phys: SUBRINA SUNDIL  IMPRESSIONS   1. Left ventricular ejection fraction, by estimation, is 60 to 65%. The left ventricle has normal function. The left ventricle has no regional wall motion abnormalities. There is moderate left ventricular hypertrophy. Left ventricular diastolic parameters were normal. 2. Right ventricular systolic function is normal. The right ventricular size is normal. 3. The mitral valve is normal in structure. Trivial mitral valve regurgitation. No evidence of mitral stenosis. 4. The aortic valve is tricuspid. Aortic valve regurgitation is not visualized. No aortic stenosis is present.  FINDINGS Left Ventricle: Left ventricular ejection fraction, by estimation, is 60 to 65%. The left ventricle has normal function. The left ventricle has no regional wall  motion abnormalities. The left ventricular internal cavity size was small. There is moderate left ventricular hypertrophy. Left ventricular diastolic parameters were normal.  Right Ventricle: The right ventricular size is normal. No increase in right ventricular wall thickness. Right ventricular systolic function is normal.  Left Atrium: Left atrial size was normal in size.  Right Atrium: Right atrial size was normal in size.  Pericardium: Trivial pericardial effusion is present.  Mitral Valve: The mitral valve is normal in structure. Trivial mitral valve regurgitation. No evidence of mitral valve stenosis.  Tricuspid Valve: The tricuspid valve is normal in structure. Tricuspid valve regurgitation is trivial.  Aortic Valve: The aortic valve is tricuspid. Aortic valve regurgitation is not visualized. No aortic stenosis is present. Aortic valve peak gradient measures 4.5 mmHg.  Pulmonic Valve: The pulmonic valve was not well visualized. Pulmonic valve regurgitation is not visualized.  Aorta: The aortic root and ascending aorta are structurally normal, with no evidence of dilitation.  IAS/Shunts: The interatrial septum was not well visualized.   LEFT VENTRICLE PLAX 2D LVIDd:         3.30 cm   Diastology LVIDs:         2.10 cm   LV e' medial:    7.62 cm/s LV PW:         1.20 cm   LV E/e' medial:  9.3 LV IVS:        1.20 cm   LV e' lateral:   12.10 cm/s LVOT diam:     2.60 cm   LV E/e' lateral: 5.8 LV SV:         78 LV SV Index:   34 LVOT Area:     5.31 cm   RIGHT VENTRICLE RV S prime:     17.40 cm/s TAPSE (M-mode): 1.9 cm  LEFT ATRIUM           Index        RIGHT ATRIUM           Index LA diam:      4.10 cm 1.79 cm/m   RA Area:     12.70 cm LA Vol (A2C): 32.4 ml 14.11 ml/m  RA Volume:   26.70 ml  11.63 ml/m LA Vol (A4C): 46.3 ml 20.17 ml/m AORTIC VALVE AV Area (Vmax): 4.25 cm AV Vmax:        106.00 cm/s AV Peak Grad:   4.5 mmHg LVOT Vmax:      84.90 cm/s LVOT  Vmean:     56.600 cm/s LVOT VTI:       0.147 m  AORTA Ao Root diam: 3.40 cm Ao Asc diam:  3.10 cm  MITRAL VALVE MV Area (PHT): 3.77 cm    SHUNTS MV Decel Time: 201 msec    Systemic VTI:  0.15 m MV E velocity: 70.70 cm/s  Systemic Diam: 2.60 cm MV A velocity: 51.90 cm/s MV E/A ratio:  1.36  Lonni Nanas MD Electronically signed by Lonni Nanas MD Signature Date/Time: 11/04/2023/12:37:18 PM    Final          ______________________________________________________________________________________________      Risk Assessment/Calculations       STOP-Bang Score:  4      Physical Exam VS:  BP 116/70   Pulse 97   Ht 5' 11 (1.803 m)   Wt 250 lb (113.4 kg)   SpO2 96%   BMI 34.87 kg/m        Wt Readings from Last 3 Encounters:  01/02/24 250 lb (113.4 kg)  11/20/23 247 lb 3.2 oz (112.1 kg)  11/12/23 242 lb 15.2 oz (110.2 kg)    GEN: Well nourished, well developed in no acute distress. Sitting comfortably on the exam table  NECK: No JVD CARDIAC: RRR, no murmurs, rubs, gallops. Radial pulses 2+ bilaterally  RESPIRATORY:  Clear to auscultation without rales, wheezing or rhonchi. Normal WOB on room air   ABDOMEN: Soft, non-tender, non-distended EXTREMITIES:  No edema in BLE; No deformity   ASSESSMENT AND PLAN   Nonobstructive CAD  - Patient admitted in 10/2023 with chest pain concerning for unstable angina. Cath on 9/15 showed slow flow in the coronary vessels suggestive of microvascular dysfunction, moderate disease in the mid-distal LAD and mild disease in the distal RCA - Started on imdur 30 mg daily at last visit for persistent chest pain.  Overall has been chest pain-free.  Able to play basketball and lift weights without chest pain or dyspnea on exertion.  He  was seen in the ED on 11/7 with mild chest pain in the setting of elevated blood pressure.  Troponin flat at 20 and EKG nonischemic.  Has not had chest pain since this time - Increase Imdur to  60 mg daily to better control BP and prevent chest pain - Encouraged patient to continue to increase physical activity as tolerated  - Continue ASA 81 mg daily  - Continue crestor  20 mg daily and fenofibrate  160 mg daily  - patient is working with PCP and endocrinology to better control diabetes    HTN  - BP well controlled - no symptoms of orthostatic hypotension  - Increase imdur to 60 mg daily for anti-anginal benefit    HLD  - Lipid panel 10/2023 with LDL 195, HDL 39, triglycerides 717, total cholesterol 324  - Continue crestor  20 mg daily (new during admission 10/2023)  - Continue fenofibrate  160 mg daily  - Ordered repeat lipids, LFTs    Sleep disordered breathing -Patient reports frequently feeling fatigued during the day.  He does snore at night.  STOP-BANG 4 - Sleep study pending    Type 2 DM - A1c 12.4 during admission in 10/2023  - On insulin , jardiance , metformin .  followed by PCP and recently established with endocrinology   Dispo: Follow up in 5 months with Dr. Kate   Signed, Rollo FABIENE Louder, PA-C

## 2023-12-23 ENCOUNTER — Other Ambulatory Visit: Payer: Self-pay | Admitting: Family Medicine

## 2023-12-23 DIAGNOSIS — E1165 Type 2 diabetes mellitus with hyperglycemia: Secondary | ICD-10-CM

## 2023-12-24 ENCOUNTER — Other Ambulatory Visit: Payer: Self-pay | Admitting: Family Medicine

## 2023-12-24 DIAGNOSIS — E1165 Type 2 diabetes mellitus with hyperglycemia: Secondary | ICD-10-CM

## 2023-12-28 ENCOUNTER — Emergency Department (HOSPITAL_BASED_OUTPATIENT_CLINIC_OR_DEPARTMENT_OTHER)
Admission: EM | Admit: 2023-12-28 | Discharge: 2023-12-28 | Disposition: A | Discharge status: Home / Self Care | Attending: Emergency Medicine | Admitting: Emergency Medicine

## 2023-12-28 ENCOUNTER — Emergency Department (HOSPITAL_BASED_OUTPATIENT_CLINIC_OR_DEPARTMENT_OTHER)

## 2023-12-28 ENCOUNTER — Other Ambulatory Visit: Payer: Self-pay

## 2023-12-28 ENCOUNTER — Encounter (HOSPITAL_BASED_OUTPATIENT_CLINIC_OR_DEPARTMENT_OTHER): Payer: Self-pay | Admitting: *Deleted

## 2023-12-28 DIAGNOSIS — E119 Type 2 diabetes mellitus without complications: Secondary | ICD-10-CM | POA: Diagnosis not present

## 2023-12-28 DIAGNOSIS — R079 Chest pain, unspecified: Secondary | ICD-10-CM | POA: Diagnosis present

## 2023-12-28 DIAGNOSIS — I10 Essential (primary) hypertension: Secondary | ICD-10-CM | POA: Diagnosis not present

## 2023-12-28 DIAGNOSIS — Z7982 Long term (current) use of aspirin: Secondary | ICD-10-CM | POA: Insufficient documentation

## 2023-12-28 DIAGNOSIS — Z794 Long term (current) use of insulin: Secondary | ICD-10-CM | POA: Insufficient documentation

## 2023-12-28 DIAGNOSIS — I2510 Atherosclerotic heart disease of native coronary artery without angina pectoris: Secondary | ICD-10-CM | POA: Diagnosis not present

## 2023-12-28 DIAGNOSIS — Z7984 Long term (current) use of oral hypoglycemic drugs: Secondary | ICD-10-CM | POA: Diagnosis not present

## 2023-12-28 LAB — CBC
HCT: 42.4 % (ref 39.0–52.0)
Hemoglobin: 13.3 g/dL (ref 13.0–17.0)
MCH: 26 pg (ref 26.0–34.0)
MCHC: 31.4 g/dL (ref 30.0–36.0)
MCV: 83 fL (ref 80.0–100.0)
Platelets: 425 K/uL — ABNORMAL HIGH (ref 150–400)
RBC: 5.11 MIL/uL (ref 4.22–5.81)
RDW: 14.2 % (ref 11.5–15.5)
WBC: 5.5 K/uL (ref 4.0–10.5)
nRBC: 0 % (ref 0.0–0.2)

## 2023-12-28 LAB — BASIC METABOLIC PANEL WITH GFR
Anion gap: 12 (ref 5–15)
BUN: 13 mg/dL (ref 6–20)
CO2: 26 mmol/L (ref 22–32)
Calcium: 9.7 mg/dL (ref 8.9–10.3)
Chloride: 104 mmol/L (ref 98–111)
Creatinine, Ser: 1.03 mg/dL (ref 0.61–1.24)
GFR, Estimated: >60 mL/min (ref >60)
Glucose, Bld: 148 mg/dL — ABNORMAL HIGH (ref 70–99)
Potassium: 4.1 mmol/L (ref 3.5–5.1)
Sodium: 141 mmol/L (ref 135–145)

## 2023-12-28 LAB — TROPONIN T, HIGH SENSITIVITY
Troponin T High Sensitivity: 20 ng/L — ABNORMAL HIGH (ref 0–19)
Troponin T High Sensitivity: 20 ng/L — ABNORMAL HIGH (ref 0–19)

## 2023-12-28 MED ORDER — NITROGLYCERIN 0.4 MG SL SUBL
0.4000 mg | SUBLINGUAL_TABLET | Freq: Once | SUBLINGUAL | Status: AC
  Administered 2023-12-28: 0.4 mg via SUBLINGUAL
  Filled 2023-12-28: qty 1

## 2023-12-28 NOTE — ED Provider Notes (Signed)
 [place] [facility] Provider Note   CSN: [id number] Arrival date & time: 12/28/23  1415     Patient presents with: Chest Pain   Ricky [name] is a 33 y.o. male.   Patient here with chest pain since this morning.  History of mild CAD on Imdur aspirin .  History of cardiac disease in the family.  He denies any shortness of breath cough sputum production.  Nothing makes worse or better.  Nitroglycerin  did not help that much at home.  He had heart catheterization about a month or so ago.  He is following with cardiology.  Blood sugars been doing much better.  He has been working out and not having any pain when he works out.  He had 1 episode of pain about a week after his heart cath had unremarkable workup.  Today was the second time he had pain.  Denies any reflux symptoms.  No abdominal pain.  The history is provided by the patient.       Prior to Admission medications   Medication Sig Start Date End Date Taking? Authorizing Provider  aspirin  EC 81 MG tablet Take 1 tablet (81 mg total) by mouth daily. Swallow whole. 11/06/23   Davia Nydia POUR, MD  Continuous Glucose Receiver (DEXCOM G7 RECEIVER) DEVI Use for checking sugars 07/25/23   Colette Torrence GRADE, MD  Continuous Glucose Sensor (DEXCOM G7 SENSOR) MISC USE TO CHECK SUGARS DAILY 12/23/23   Booker Darice SAUNDERS, FNP  empagliflozin  (JARDIANCE ) 25 MG TABS tablet Take 1 tablet (25 mg total) by mouth daily. 07/12/23   Colette Torrence GRADE, MD  fenofibrate  160 MG tablet Take 1 tablet (160 mg total) by mouth daily. 11/06/23   Rai, Nydia POUR, MD  hydrOXYzine  (ATARAX ) 10 MG tablet Take 1 tablet (10 mg total) by mouth at bedtime as needed. Patient taking differently: Take 10 mg by mouth at bedtime as needed for anxiety. 10/10/23   Ezzard Staci SAILOR, NP  insulin  glargine (LANTUS ) 100 UNIT/ML injection Inject 0.4 mLs (40 Units total) into the skin daily. 07/25/23 01/21/24  Colette Torrence GRADE, MD  isosorbide mononitrate  (IMDUR) 30 MG 24 hr tablet Take 1 tablet (30 mg total) by mouth daily. 11/20/23   Vicci Rollo SAUNDERS, PA-C  lamoTRIgine  (LAMICTAL ) 25 MG tablet Take 1 tablet (25 mg total) by mouth daily for 7 days, THEN 2 tablets (50 mg total) daily. Patient taking differently: Taking 75mg  daily 10/10/23 11/20/23  Ezzard Staci SAILOR, NP  metFORMIN  (GLUCOPHAGE -XR) 500 MG 24 hr tablet TAKE 2 TABLETS (1,000 MG TOTAL) BY MOUTH 2 (TWO) TIMES DAILY WITH A MEAL. 08/16/23   Colette Torrence GRADE, MD  nitroGLYCERIN  (NITROSTAT ) 0.4 MG SL tablet Place 1 tablet (0.4 mg total) under the tongue every 5 (five) minutes as needed for chest pain. 11/05/23   Rai, Nydia POUR, MD  pantoprazole  (PROTONIX ) 40 MG tablet Take 1 tablet (40 mg total) by mouth daily. 11/05/23   Rai, Nydia POUR, MD  rosuvastatin  (CRESTOR ) 20 MG tablet Take 1 tablet (20 mg total) by mouth daily. 11/06/23   Rai, Ripudeep K, MD  Semaglutide ,0.25 or 0.5MG /DOS, (OZEMPIC , 0.25 OR 0.5 MG/DOSE,) 2 MG/3ML SOPN INJECT 0.25MG  INTO THE SKIN ONE TIME PER WEEK 12/23/23   Booker Darice SAUNDERS, FNP    Allergies: Patient has no known allergies.    Review of Systems  Updated Vital Signs BP 119/62   Pulse 92   Temp 97.6 F (36.4 C) (Oral)   Resp 18  SpO2 100%   Physical Exam Vitals and nursing note reviewed.  Constitutional:      General: He is not in acute distress.    Appearance: He is well-developed. He is not ill-appearing.  HENT:     Head: Normocephalic and atraumatic.  Eyes:     Conjunctiva/sclera: Conjunctivae normal.     Pupils: Pupils are equal, round, and reactive to light.  Cardiovascular:     Rate and Rhythm: Normal rate and regular rhythm.     Pulses:          Radial pulses are 2+ on the right side and 2+ on the left side.     Heart sounds: Normal heart sounds. No murmur heard. Pulmonary:     Effort: Pulmonary effort is normal. No respiratory distress.     Breath sounds: Normal breath sounds. No decreased breath sounds or wheezing.  Abdominal:     Palpations:  Abdomen is soft.     Tenderness: There is no abdominal tenderness.  Musculoskeletal:        General: No swelling.     Cervical back: Normal range of motion and neck supple.  Skin:    General: Skin is warm and dry.     Capillary Refill: Capillary refill takes less than 2 seconds.  Neurological:     General: No focal deficit present.     Mental Status: He is alert.  Psychiatric:        Mood and Affect: Mood normal.     (all labs ordered are listed, but only abnormal results are displayed) Labs Reviewed  BASIC METABOLIC PANEL WITH GFR - Abnormal; Notable for the following components:      Result Value   Glucose, Bld 148 (*)    All other components within normal limits  CBC - Abnormal; Notable for the following components:   Platelets 425 (*)    All other components within normal limits  TROPONIN T, HIGH SENSITIVITY - Abnormal; Notable for the following components:   Troponin T High Sensitivity 20 (*)    All other components within normal limits  TROPONIN T, HIGH SENSITIVITY - Abnormal; Notable for the following components:   Troponin T High Sensitivity 20 (*)    All other components within normal limits    EKG: EKG Interpretation Date/Time:  Friday December 28 2023 14:26:13 EST Ventricular Rate:  92 PR Interval:  162 QRS Duration:  90 QT Interval:  336 QTC Calculation: 416 R Axis:   58  Text Interpretation: Sinus rhythm Confirmed by Ruthe Cornet [id number]) on 12/28/2023 2:59:43 PM  Radiology: [facility] Chest 2 View Result Date: 12/28/2023 EXAM: 2 VIEW(S) XRAY OF THE CHEST 12/28/2023 03:06:29 PM COMPARISON: 11/12/2023 CLINICAL HISTORY: cp cp FINDINGS: LUNGS AND PLEURA: No focal pulmonary opacity. No pulmonary edema. No pleural effusion. No pneumothorax. HEART AND MEDIASTINUM: No acute abnormality of the cardiac and mediastinal silhouettes. BONES AND SOFT TISSUES: No acute osseous abnormality. IMPRESSION: 1. No acute cardiopulmonary abnormality. Electronically signed by: Lynwood Seip  MD 12/28/2023 03:33 PM EST RP Workstation: [id number]     Procedures   Medications Ordered in the ED  nitroGLYCERIN  (NITROSTAT ) SL tablet 0.4 mg (0.4 mg Sublingual Given 12/28/23 1527)                                    Medical Decision Making Amount and/or Complexity of Data Reviewed Labs: ordered. Radiology: ordered.  Risk Prescription drug management.  Ricky [name] is here with chest pain.  History of mild CAD.  Hypertension diabetes.  Overall vital signs unremarkable here.  Blood pressure is mildly elevated in the 140s.  Given a dose of nitroglycerin  here that improved blood pressure.  He had a heart catheterization back in September that I reviewed.  He had mild disease in multiple vessels.  He has been following with cardiology.  He has been working out and not having pain when he works out.  He has had some pain on and off since that heart cath.  He has been working with cardiology about his blood pressure management.  He started Imdur recently.  He has an appointment with them next week.  Overall EKG shows sinus rhythm.  No ischemic changes.  Differential diagnosis likely some chronic anginal process or MSK type process or stress related process seems less likely to be infection process.  Have no concern for dissection or PE.  He is not having any respiratory symptoms.  He is not in a lot of discomfort.  He is got good pulses throughout.  Will get CBC CMP troponin chest x-ray.  Troponin at baseline at 20.  No significant leukocytosis anemia or electrolyte abnormality.  Chest x-ray shows no evidence of pneumothorax.  I talked with Dr. Lonni cardiology.  Will get a second troponin.  Can consider increasing the Imdur to 60 mg daily.  Can do 30 mg twice a day.  May be blood pressure being mildly elevated causing some discomfort today.  He actually has follow-up with cardiology in a couple days.  If troponin is stable can follow-up outpatient.  Repeat troponin stable at 20.   Blood pressure is improved to 119/62.  Overall we will have him follow-up with cardiology next week to let them make any further medication adjustments particular to his blood pressure meds.  Discharged in good condition.  Told return if symptoms worsen.  This chart was dictated using voice recognition software.  Despite best efforts to proofread,  errors can occur which can change the documentation meaning.      Final diagnoses:  Nonspecific chest pain    ED Discharge Orders     None          Ruthe Cornet, DO 12/28/23 1714

## 2023-12-28 NOTE — Discharge Instructions (Signed)
 Follow-up with cardiology this week.  Return if symptoms worsen.

## 2023-12-28 NOTE — ED Triage Notes (Signed)
 Pt is here for mid to left sided chest pain which began around 8am and feels like someone sitting on his chest.  Pt had some sob with activity.  Pt has had some shoulder and neck pain since this began.

## 2023-12-28 NOTE — ED Notes (Signed)
 Pt states no change in chest pain after nitroglycerin  administration

## 2024-01-02 ENCOUNTER — Ambulatory Visit: Attending: Cardiology | Admitting: Cardiology

## 2024-01-02 ENCOUNTER — Other Ambulatory Visit (HOSPITAL_COMMUNITY): Payer: Self-pay

## 2024-01-02 ENCOUNTER — Encounter: Payer: Self-pay | Admitting: Cardiology

## 2024-01-02 VITALS — BP 116/70 | HR 97 | Ht 71.0 in | Wt 250.0 lb

## 2024-01-02 DIAGNOSIS — I10 Essential (primary) hypertension: Secondary | ICD-10-CM | POA: Diagnosis not present

## 2024-01-02 DIAGNOSIS — E1165 Type 2 diabetes mellitus with hyperglycemia: Secondary | ICD-10-CM

## 2024-01-02 DIAGNOSIS — I2510 Atherosclerotic heart disease of native coronary artery without angina pectoris: Secondary | ICD-10-CM | POA: Diagnosis not present

## 2024-01-02 DIAGNOSIS — E782 Mixed hyperlipidemia: Secondary | ICD-10-CM | POA: Diagnosis not present

## 2024-01-02 DIAGNOSIS — G4730 Sleep apnea, unspecified: Secondary | ICD-10-CM

## 2024-01-02 DIAGNOSIS — Z794 Long term (current) use of insulin: Secondary | ICD-10-CM

## 2024-01-02 MED ORDER — ISOSORBIDE MONONITRATE ER 60 MG PO TB24: 60.0000 mg | ORAL_TABLET | Freq: Every day | ORAL | 1 refills | Status: DC

## 2024-01-02 NOTE — Patient Instructions (Addendum)
 Medication Instructions:  INCREASE IMDUR(Isosorbide) 60mg  Take 1 tablet once a day  *If you need a refill on your cardiac medications before your next appointment, please call your pharmacy*  Lab Work: TODAY-LIPIDS & LFTS If you have labs (blood work) drawn today and your tests are completely normal, you will receive your results only by: MyChart Message (if you have MyChart) OR A paper copy in the mail If you have any lab test that is abnormal or we need to change your treatment, we will call you to review the results.  Testing/Procedures: NONE ORDERED  Follow-Up: At [facility], you and your health needs are our priority.  As part of our continuing mission to provide you with exceptional heart care, our providers are all part of one team.  This team includes your primary Cardiologist (physician) and Advanced Practice Providers or APPs (Physician Assistants and Nurse Practitioners) who all work together to provide you with the care you need, when you need it.  Your next appointment:   5-6 month(s)  Provider:   Lonni LITTIE Nanas, MD    We recommend signing up for the patient portal called MyChart.  Sign up information is provided on this After Visit Summary.  MyChart is used to connect with patients for Virtual Visits (Telemedicine).  Patients are able to view lab/test results, encounter notes, upcoming appointments, etc.  Non-urgent messages can be sent to your provider as well.   To learn more about what you can do with MyChart, go to [email or web address].   Other Instructions

## 2024-01-03 ENCOUNTER — Ambulatory Visit: Payer: Self-pay | Admitting: Cardiology

## 2024-01-03 LAB — LIPID PANEL
Chol/HDL Ratio: 3.5 ratio (ref 0.0–5.0)
Cholesterol, Total: 144 mg/dL (ref 100–199)
HDL: 41 mg/dL (ref >39)
LDL Chol Calc (NIH): 80 mg/dL (ref 0–99)
Triglycerides: 130 mg/dL (ref 0–149)
VLDL Cholesterol Cal: 23 mg/dL (ref 5–40)

## 2024-01-03 LAB — HEPATIC FUNCTION PANEL
ALT: 19 IU/L (ref 0–44)
AST: 13 IU/L (ref 0–40)
Albumin: 4.7 g/dL (ref 4.1–5.1)
Alkaline Phosphatase: 61 IU/L (ref 47–123)
Bilirubin Total: 0.2 mg/dL (ref 0.0–1.2)
Bilirubin, Direct: 0.1 mg/dL (ref 0.00–0.40)
Total Protein: 7.8 g/dL (ref 6.0–8.5)

## 2024-01-10 ENCOUNTER — Other Ambulatory Visit (HOSPITAL_COMMUNITY): Payer: Self-pay | Admitting: Family

## 2024-01-10 DIAGNOSIS — F332 Major depressive disorder, recurrent severe without psychotic features: Secondary | ICD-10-CM

## 2024-01-22 ENCOUNTER — Telehealth (HOSPITAL_COMMUNITY): Admitting: Family

## 2024-01-22 DIAGNOSIS — F332 Major depressive disorder, recurrent severe without psychotic features: Secondary | ICD-10-CM

## 2024-01-22 MED ORDER — LAMOTRIGINE 100 MG PO TABS: 100.0000 mg | ORAL_TABLET | Freq: Every day | ORAL | 0 refills | Status: DC

## 2024-01-22 NOTE — Progress Notes (Cosign Needed Addendum)
 Virtual Visit via Video Note  I connected with Yoltzin Barg on 01/22/24 at  2:00 PM EST by a video enabled telemedicine application and verified that I am speaking with the correct person using two identifiers.  Location: Patient: Set Designer  Provider: Office   I discussed the limitations of evaluation and management by telemedicine and the availability of in person appointments. The patient expressed understanding and agreed to proceed.    I discussed the assessment and treatment plan with the patient. The patient was provided an opportunity to ask questions and all were answered. The patient agreed with the plan and demonstrated an understanding of the instructions.   The patient was advised to call back or seek an in-person evaluation if the symptoms worsen or if the condition fails to improve as anticipated.  I provided 30 minutes of non-face-to-face time during this encounter.   Staci LOISE Kerns, NP   [facility] MD/PA/NP OP Progress Note  01/22/2024 3:02 PM Aviv Lengacher  MRN:  [id number]  Chief Complaint:   Medication management   Uday Jantz is a 33 year old African-American male seen and evaluated via virtual platform careagility.  Carries a diagnosis related to generalized anxiety disorder, unspecified mood disorder.  Per initial assessment patient was initiated on Lamictal  25 mg daily, with instructions to taper to 50 mg and follow-up in 1 month.  Patient reports confusion related to medication taper.  Stated  I misunderstood.  Reports he has been taking Lamictal  125 mg daily.  States medication refills has been provided by his primary care, MD Rucker.   Townsend stated he lost previous prescription to which he had to pay out-of-pocket so he has been utilizing additional medications to make current dose of Lamictal  125 mg, reports he is has not noticed any anxiety or depression.  States he feels like his mood has been stable.  Reports recently hospitalized due to :mild heart  attack reports he has been prescribed multiple medications since then.  Reports he continues to get refills by her primary care provider.  He reports  appetite has been curved due to taking Ozempic .  Reports he is resting well throughout the night.   [facility] reported [facility]. Pharmacist has discontinued all refills from [facility].  Discussed continuing Lamictal  100 mg.  Discussed taking 1 tablet daily. Patient denied skin rash, blisters or flu like symptoms. He denied that he had expericed any profuse sweating or increased heat rate   Follow-up 2 months.  60 tablets was provided.  no refills    HPI:  Visit Diagnosis:    ICD-10-CM   1. Severe episode of recurrent major depressive disorder, without psychotic features (HCC)  F33.2 lamoTRIgine  (LAMICTAL ) 100 MG tablet      Past Psychiatric History: H/O major depressive disorder, generalized anxiety disorder, mood disorder unclassified.  Unable to recall previous psychotropic medications that he is tried previously.  Currently taking Lamictal  125?  Past Medical History:  Past Medical History:  Diagnosis Date   Asthma    Diabetes mellitus without complication (HCC)    Mood disorder     Past Surgical History:  Procedure Laterality Date   LEFT HEART CATH AND CORONARY ANGIOGRAPHY N/A 11/05/2023   Procedure: LEFT HEART CATH AND CORONARY ANGIOGRAPHY;  Surgeon: Ladona Heinz, MD;  Location: [facility] INVASIVE CV LAB;  Service: Cardiovascular;  Laterality: N/A;   WISDOM TOOTH EXTRACTION      Family Psychiatric History:   Family History:  Family History  Problem Relation Age of Onset  Bipolar disorder Mother    CAD Mother    CAD Father    Schizophrenia Maternal Grandmother    CAD Other     Social History:  Social History   Socioeconomic History   Marital status: Single    Spouse name: Not on file   Number of children: 0   Years of education: Not on file   Highest education level: Not on file   Occupational History   Not on file  Tobacco Use   Smoking status: Never    Passive exposure: Never   Smokeless tobacco: Never  Vaping Use   Vaping status: Some Days  Substance and Sexual Activity   Alcohol use: Yes    Comment: occ   Drug use: No   Sexual activity: Not Currently  Other Topics Concern   Not on file  Social History Narrative   Not on file   Social Drivers of Health   Financial Resource Strain: Low Risk  (03/28/2022)   Received from [facility]   Overall Financial Resource Strain (CARDIA)    Difficulty of Paying Living Expenses: Not hard at all  Food Insecurity: No Food Insecurity (11/08/2023)   Hunger Vital Sign    Worried About Running Out of Food in the Last Year: Never true    Ran Out of Food in the Last Year: Never true  Transportation Needs: No Transportation Needs (11/08/2023)   PRAPARE - Administrator, Civil Service (Medical): No    Lack of Transportation (Non-Medical): No  Physical Activity: Sufficiently Active (03/28/2022)   Received from [facility]   Exercise Vital Sign    On average, how many days per week do you engage in moderate to strenuous exercise (like a brisk walk)?: 5 days    On average, how many minutes do you engage in exercise at this level?: 30 min  Stress: Stress Concern Present (03/28/2022)   Received from [facility] - Occupational Stress Questionnaire    Feeling of Stress : To some extent  Social Connections: Unknown (11/03/2023)   Social Connection and Isolation Panel    Frequency of Communication with Friends and Family: More than three times a week    Frequency of Social Gatherings with Friends and Family: Once a week    Attends Religious Services: Patient declined    Database Administrator or Organizations: Patient declined    Attends Engineer, Structural: Patient declined    Marital Status: Living with partner    Allergies: No Known Allergies  Metabolic  Disorder Labs: Lab Results  Component Value Date   HGBA1C 12.4 (H) 11/03/2023   MPG 309.18 11/03/2023   No results found for: PROLACTIN Lab Results  Component Value Date   CHOL 144 01/02/2024   TRIG 130 01/02/2024   HDL 41 01/02/2024   CHOLHDL 3.5 01/02/2024   VLDL UNABLE TO CALCULATE IF TRIGLYCERIDE OVER 400 mg/dL 90/85/7974   LDLCALC 80 01/02/2024   LDLCALC UNABLE TO CALCULATE IF TRIGLYCERIDE OVER 400 mg/dL 90/85/7974   No results found for: TSH  Therapeutic Level Labs: No results found for: LITHIUM No results found for: VALPROATE No results found for: CBMZ  Current Medications: Current Outpatient Medications  Medication Sig Dispense Refill   aspirin  EC 81 MG tablet Take 1 tablet (81 mg total) by mouth daily. Swallow whole. 30 tablet 12   Continuous Glucose Receiver (DEXCOM G7 RECEIVER) DEVI Use for checking sugars 1 each 5   Continuous  Glucose Sensor (DEXCOM G7 SENSOR) MISC USE TO CHECK SUGARS DAILY 3 each 5   empagliflozin  (JARDIANCE ) 25 MG TABS tablet Take 1 tablet (25 mg total) by mouth daily. 30 tablet 1   fenofibrate  160 MG tablet Take 1 tablet (160 mg total) by mouth daily. 30 tablet 3   hydrOXYzine  (ATARAX ) 10 MG tablet Take 1 tablet (10 mg total) by mouth at bedtime as needed. 30 tablet 0   isosorbide  mononitrate (IMDUR ) 60 MG 24 hr tablet Take 1 tablet (60 mg total) by mouth daily. 90 tablet 1   lamoTRIgine  (LAMICTAL ) 100 MG tablet Take 1 tablet (100 mg total) by mouth daily. 60 tablet 0   metFORMIN  (GLUCOPHAGE -XR) 500 MG 24 hr tablet TAKE 2 TABLETS (1,000 MG TOTAL) BY MOUTH 2 (TWO) TIMES DAILY WITH A MEAL. 360 tablet 1   nitroGLYCERIN  (NITROSTAT ) 0.4 MG SL tablet Place 1 tablet (0.4 mg total) under the tongue every 5 (five) minutes as needed for chest pain. 30 tablet 12   pantoprazole  (PROTONIX ) 40 MG tablet Take 1 tablet (40 mg total) by mouth daily. 30 tablet 3   rosuvastatin  (CRESTOR ) 20 MG tablet Take 1 tablet (20 mg total) by mouth daily. 30 tablet 3    Semaglutide ,0.25 or 0.5MG /DOS, (OZEMPIC , 0.25 OR 0.5 MG/DOSE,) 2 MG/3ML SOPN INJECT 0.25MG  INTO THE SKIN ONE TIME PER WEEK 3 mL 0   TOUJEO  MAX SOLOSTAR 300 UNIT/ML Solostar Pen Inject into the skin daily.     No current facility-administered medications for this visit.     Musculoskeletal: Virtual assessment   Psychiatric Specialty Exam: Review of Systems  There were no vitals taken for this visit.There is no height or weight on file to calculate BMI.  General Appearance: Casual  Eye Contact:  Good  Speech:  Clear and Coherent  Volume:  Normal  Mood:  Euthymic  Affect:  Congruent  Thought Process:  Coherent  Orientation:  Full (Time, Place, and Person)  Thought Content: Logical   Suicidal Thoughts:  No  Homicidal Thoughts:  No  Memory:  Immediate;   Good Recent;   Good  Judgement:  Good  Insight:  Good  Psychomotor Activity:  Normal  Concentration:  Concentration: Fair  Recall:  Good  Fund of Knowledge: Good  Language: Good  Akathisia:  No  Handed:  Right  AIMS (if indicated): not done  Assets:  Communication Skills Desire for Improvement  ADL's:  Intact  Cognition: WNL  Sleep:  Good   Screenings: GAD-7    Flowsheet Row Telephone from 11/08/2023 in [place] [facility]PART[facility] Office Visit from 07/25/2023 in [facility] at [facility] Visit from 07/11/2023 in [facility] Primary Care at [facility]  Total GAD-7 Score 0 7 21   PHQ2-9    Flowsheet Row Telephone from 11/08/2023 in  POPULATION HEALTH DEPARTMENT Office Visit from 10/10/2023 in [facility] Office Visit from 07/25/2023 in [facility] Primary Care at [facility] Visit from 07/11/2023 in [facility] Total Score 0 5 2 6   PHQ-9 Total Score -- 18 11 24    Flowsheet Row ED from 12/28/2023 in [facility] Emergency Department at [facility] ED from 11/12/2023 in [facility]  Emergency Department at [facility] ED to Hosp-Admission (Discharged) from 11/03/2023 in [facility] 4E CV SURGICAL PROGRESSIVE CARE  C-SSRS RISK CATEGORY No Risk No Risk No Risk     Assessment and Plan:  Ole  Shannahan 32 year old African-American male presents for medication management follow-up.  Seen by this provider for initial assessment 10/10/2023.  Reports primary care provider has been providing medication refills as he reports he has been self titrating medication and is currently taking Lamictal  125 mg and feels that his mood is stabilized.  States few months ago diagnosed with chest pain/heart attack.  Reports misunderstanding related to medication taper.  Denying symptoms related to titrating Lamictal .  To include skin rash, nausea vomiting or diarrhea dizziness or confusion.  Discussed taking Lamictal  100 mg daily.  Making 60 tablets available patient to follow-up 2 months for medication adherence/tolerability.  Collaboration of Care: Collaboration of Care: Medication Management [facility]ue primary care providers previous order would Lamictal  25 mg to 50 mg titration schedule  Patient/Guardian was advised Release of Information must be obtained prior to any record release in order to collaborate their care with an outside provider. Patient/Guardian was advised if they have not already done so to contact the registration department to sign all necessary forms in order for us  to release information regarding their care.   Consent: Patient/Guardian gives verbal consent for treatment and assignment of benefits for services provided during this visit. Patient/Guardian expressed understanding and agreed to proceed.    Staci LOISE Kerns, NP 01/22/2024, 3:02 PM

## 2024-01-24 ENCOUNTER — Other Ambulatory Visit (HOSPITAL_COMMUNITY): Payer: Self-pay

## 2024-02-18 ENCOUNTER — Other Ambulatory Visit (HOSPITAL_COMMUNITY): Payer: Self-pay

## 2024-02-20 ENCOUNTER — Telehealth: Payer: Self-pay | Admitting: Cardiology

## 2024-02-20 MED ORDER — PANTOPRAZOLE SODIUM 40 MG PO TBEC: 40.0000 mg | DELAYED_RELEASE_TABLET | Freq: Every day | ORAL | 3 refills | Status: DC

## 2024-02-20 MED ORDER — FENOFIBRATE 160 MG PO TABS: 160.0000 mg | ORAL_TABLET | Freq: Every day | ORAL | 3 refills | Status: DC

## 2024-02-20 MED ORDER — ROSUVASTATIN CALCIUM 20 MG PO TABS: 20.0000 mg | ORAL_TABLET | Freq: Every day | ORAL | 3 refills | Status: DC

## 2024-02-20 MED ORDER — ISOSORBIDE MONONITRATE ER 60 MG PO TB24: 60.0000 mg | ORAL_TABLET | Freq: Every day | ORAL | 3 refills | Status: AC

## 2024-02-20 NOTE — Telephone Encounter (Signed)
isosorbide 

## 2024-02-20 NOTE — Telephone Encounter (Signed)
" °*  STAT* If patient is at the pharmacy, call can be transferred to refill team.   1. Which medications need to be refilled? (please list name of each medication and dose if known)   fenofibrate  160 MG tablet  pantoprazole  (PROTONIX ) 40 MG tablet  rosuvastatin  (CRESTOR ) 20 MG tablet   2. Would you like to learn more about the convenience, safety, & potential cost savings by using the [facility]? No   3. Are you open to using the [facility] ([facility]. No    4. Which pharmacy/location (including street and city if local pharmacy) is medication to be sent to? [id number] [place] [place] - [address]     5. Do they need a 30 day or 90 day supply? 90day   Pt asking if K. Vicci will refill his Rxs from the hospital.  "

## 2024-02-20 NOTE — Telephone Encounter (Signed)
 Left message with call back number, informed that they can also send a MyChart message

## 2024-02-20 NOTE — Telephone Encounter (Signed)
 Pt called to request medication refills. He can't remember one of his medications and requesting c/b to discuss. Please advise.

## 2024-02-20 NOTE — Telephone Encounter (Signed)
 Patient returned RN's call regarding medications.

## 2024-02-25 MED ORDER — PANTOPRAZOLE SODIUM 40 MG PO TBEC: 40.0000 mg | DELAYED_RELEASE_TABLET | Freq: Every day | ORAL | 3 refills | Status: AC

## 2024-02-25 MED ORDER — FENOFIBRATE 160 MG PO TABS: 160.0000 mg | ORAL_TABLET | Freq: Every day | ORAL | 3 refills | Status: AC

## 2024-02-25 MED ORDER — ROSUVASTATIN CALCIUM 20 MG PO TABS: 20.0000 mg | ORAL_TABLET | Freq: Every day | ORAL | 3 refills | Status: AC

## 2024-02-25 NOTE — Telephone Encounter (Signed)
 Pt's medications were sent to pt's pharmacy as requested. Confirmation received.

## 2024-03-16 ENCOUNTER — Other Ambulatory Visit (HOSPITAL_COMMUNITY): Payer: Self-pay | Admitting: Family

## 2024-03-16 DIAGNOSIS — F332 Major depressive disorder, recurrent severe without psychotic features: Secondary | ICD-10-CM

## 2024-03-26 ENCOUNTER — Telehealth (HOSPITAL_COMMUNITY): Admitting: Family

## 2024-03-26 DIAGNOSIS — F332 Major depressive disorder, recurrent severe without psychotic features: Secondary | ICD-10-CM

## 2024-03-26 MED ORDER — LAMOTRIGINE 100 MG PO TABS: 100.0000 mg | ORAL_TABLET | Freq: Every day | ORAL | 0 refills | Status: AC

## 2024-03-26 NOTE — Progress Notes (Addendum)
 " Virtual Visit via Video Note  I connected with Ricky [name] on 03/26/24 at  8:30 AM EST by a video enabled telemedicine application and verified that I am speaking with the correct person using two identifiers.  Location: Patient: Home Provider: Vehicle   I discussed the limitations of evaluation and management by telemedicine and the availability of in person appointments. The patient expressed understanding and agreed to proceed.   I discussed the assessment and treatment plan with the patient. The patient was provided an opportunity to ask questions and all were answered. The patient agreed with the plan and demonstrated an understanding of the instructions.   The patient was advised to call back or seek an in-person evaluation if the symptoms worsen or if the condition fails to improve as anticipated.  I provided 15 minutes of non-face-to-face time during this encounter.   Staci LOISE Kerns, NP   [facility] MD/PA/NP OP Progress Note  03/26/2024 9:20 AM Ricky [name]  MRN:  [id number]  Chief Complaint:  Medication management   HPI:  Ricky [name] 34 year old male presents for medication management follow-up appointment.  Seen and evaluated via virtual platform.  Currently prescribed lamotrigine  100 mg daily which was recently tapered from lamotrigine  125mg .  Reports he has been taking and tolerating medication well.  Noted no overt side effects with downward taper.  States his mood has been stabilized.  Continues to deny symptoms related to skin rash, hair loss, skin blister or flulike symptoms.  Reports he has been working constantly and recently received a promotion at work has not noticed a change in his affect mood or depression.  No concerns related to suicidal or homicidal ideations.  Denied auditory visual hallucinations.  Reports a fair appetite.  States he is rested well throughout the night.  Patient to follow-up 3 months for medication adherence/tolerability.  Support  and encouragement reassurance was provided.  Chart review recent labs completed by her primary care provider.  12/2023 lipid panel within normal function cholesterol, A1c 10.6-PA documented cholesterol is improving continue current regimen prescribed by primary care provider.     Visit Diagnosis:    ICD-10-CM   1. Severe episode of recurrent major depressive disorder, without psychotic features (HCC)  F33.2 lamoTRIgine  (LAMICTAL ) 100 MG tablet        Past Medical History:  H/O major depressive disorder, generalized anxiety disorder, mood disorder unclassified.  Unable to recall previous psychotropic medications that he is tried previously.  Currently taking Lamictal  125?    Past Surgical History:  Procedure Laterality Date   LEFT HEART CATH AND CORONARY ANGIOGRAPHY N/A 11/05/2023   Procedure: LEFT HEART CATH AND CORONARY ANGIOGRAPHY;  Surgeon: Ladona Heinz, MD;  Location: [facility] INVASIVE [facility];  Service: Cardiovascular;  Laterality: N/A;   WISDOM TOOTH EXTRACTION      Family Psychiatric History:   Family History:  Family History  Problem Relation Age of Onset   Bipolar disorder Mother    CAD Mother    CAD Father    Schizophrenia Maternal Grandmother    CAD Other     Social History:  Social History   Socioeconomic History   Marital status: Single    Spouse name: Not on file   Number of children: 0   Years of education: Not on file   Highest education level: Not on file  Occupational History   Not on file  Tobacco Use   Smoking status: Never    Passive exposure: Never   Smokeless tobacco: Never  Vaping  Use   Vaping status: Some Days  Substance and Sexual Activity   Alcohol use: Yes    Comment: occ   Drug use: No   Sexual activity: Not Currently  Other Topics Concern   Not on file  Social History Narrative   Not on file   Social Drivers of Health   Tobacco Use: Low Risk (01/02/2024)   Received from [facility]   Patient History    Smoking Tobacco Use: Never     Smokeless Tobacco Use: Never    Passive Exposure: Never  Financial Resource Strain: Low Risk (03/28/2022)   Received from [facility]   Overall Financial Resource Strain (CARDIA)    Difficulty of Paying Living Expenses: Not hard at all  Food Insecurity: No Food Insecurity (11/08/2023)   Epic    Worried About Programme Researcher, Broadcasting/film/video in the Last Year: Never true    Ran Out of Food in the Last Year: Never true  Transportation Needs: No Transportation Needs (11/08/2023)   Epic    Lack of Transportation (Medical): No    Lack of Transportation (Non-Medical): No  Physical Activity: Sufficiently Active (03/28/2022)   Received from [facility]   Exercise Vital Sign    On average, how many days per week do you engage in moderate to strenuous exercise (like a brisk walk)?: 5 days    On average, how many minutes do you engage in exercise at this level?: 30 min  Stress: Stress Concern Present (03/28/2022)   Received from [facility] - Occupational Stress Questionnaire    Feeling of Stress : To some extent  Social Connections: Unknown (11/03/2023)   Social Connection and Isolation Panel    Frequency of Communication with Friends and Family: More than three times a week    Frequency of Social Gatherings with Friends and Family: Once a week    Attends Religious Services: Patient declined    Database Administrator or Organizations: Patient declined    Attends Banker Meetings: Patient declined    Marital Status: Living with partner  Depression (PHQ2-9): Low Risk (11/08/2023)   Depression (PHQ2-9)    PHQ-2 Score: 0  Recent Concern: Depression (PHQ2-9) - High Risk (10/10/2023)   Depression (PHQ2-9)    PHQ-2 Score: 18  Alcohol Screen: Not on file  Housing: Unknown (11/08/2023)   Epic    Unable to Pay for Housing in the Last Year: No    Number of Times Moved in the Last Year: Not on file    Homeless in the Last Year: No  Utilities: Not At Risk  (11/08/2023)   Epic    Threatened with loss of utilities: No  Health Literacy: Not on file    Allergies: Allergies[1]  Metabolic Disorder Labs: Lab Results  Component Value Date   HGBA1C 12.4 (H) 11/03/2023   MPG 309.18 11/03/2023   No results found for: PROLACTIN Lab Results  Component Value Date   CHOL 144 01/02/2024   TRIG 130 01/02/2024   HDL 41 01/02/2024   CHOLHDL 3.5 01/02/2024   VLDL UNABLE TO CALCULATE IF TRIGLYCERIDE OVER 400 mg/dL 90/85/7974   LDLCALC 80 01/02/2024   LDLCALC UNABLE TO CALCULATE IF TRIGLYCERIDE OVER 400 mg/dL 90/85/7974   No results found for: TSH  Therapeutic Level Labs: No results found for: LITHIUM No results found for: VALPROATE No results found for: CBMZ  Current Medications: Current Outpatient Medications  Medication Sig Dispense Refill   aspirin  EC  81 MG tablet Take 1 tablet (81 mg total) by mouth daily. Swallow whole. 30 tablet 12   Continuous Glucose Receiver (DEXCOM G7 RECEIVER) DEVI Use for checking sugars 1 each 5   Continuous Glucose Sensor (DEXCOM G7 SENSOR) MISC USE TO CHECK SUGARS DAILY 3 each 5   empagliflozin  (JARDIANCE ) 25 MG TABS tablet Take 1 tablet (25 mg total) by mouth daily. 30 tablet 1   fenofibrate  160 MG tablet Take 1 tablet (160 mg total) by mouth daily. 90 tablet 3   hydrOXYzine  (ATARAX ) 10 MG tablet Take 1 tablet (10 mg total) by mouth at bedtime as needed. 30 tablet 0   isosorbide  mononitrate (IMDUR ) 60 MG 24 hr tablet Take 1 tablet (60 mg total) by mouth daily. 90 tablet 3   lamoTRIgine  (LAMICTAL ) 100 MG tablet Take 1 tablet (100 mg total) by mouth daily. 90 tablet 0   metFORMIN  (GLUCOPHAGE -XR) 500 MG 24 hr tablet TAKE 2 TABLETS (1,000 MG TOTAL) BY MOUTH 2 (TWO) TIMES DAILY WITH A MEAL. 360 tablet 1   nitroGLYCERIN  (NITROSTAT ) 0.4 MG SL tablet Place 1 tablet (0.4 mg total) under the tongue every 5 (five) minutes as needed for chest pain. 30 tablet 12   pantoprazole  (PROTONIX ) 40 MG tablet Take 1 tablet  (40 mg total) by mouth daily. 90 tablet 3   rosuvastatin  (CRESTOR ) 20 MG tablet Take 1 tablet (20 mg total) by mouth daily. 90 tablet 3   Semaglutide ,0.25 or 0.5MG /DOS, (OZEMPIC , 0.25 OR 0.5 MG/DOSE,) 2 MG/3ML SOPN INJECT 0.25MG  INTO THE SKIN ONE TIME PER WEEK 3 mL 0   TOUJEO  MAX SOLOSTAR 300 UNIT/ML Solostar Pen Inject into the skin daily.     No current facility-administered medications for this visit.     Musculoskeletal: Virtual assessment  Psychiatric Specialty Exam: Review of Systems  There were no vitals taken for this visit.There is no height or weight on file to calculate BMI.  General Appearance: Casual  Eye Contact:  Good  Speech:  Clear and Coherent  Volume:  Normal  Mood:  Euthymic  Affect:  Congruent  Thought Process:  Coherent  Orientation:  Full (Time, Place, and Person)  Thought Content: Logical   Suicidal Thoughts:  No  Homicidal Thoughts:  No  Memory:  Immediate;   Good Recent;   Good  Judgement:  Good  Insight:  Good  Psychomotor Activity:  Normal  Concentration:  Concentration: Good  Recall:  Good  Fund of Knowledge: Good  Language: Good  Akathisia:  No  Handed:  Right  AIMS (if indicated): not done  Assets:  Communication Skills Desire for Improvement  ADL's:  Intact  Cognition: WNL  Sleep:  Good   Screenings: GAD-7    Flowsheet Row Telephone from 11/08/2023 in [place] POPULATION HEALTH DEPARTMENT Office Visit from 07/25/2023 in [facility] at [facility] Visit from 07/11/2023 in [facility] Care at [facility]  Total GAD-7 Score 0 7 21   PHQ2-9    Flowsheet Row Telephone from 11/08/2023 in [place] POPULATION HEALTH DEPARTMENT Office Visit from 10/10/2023 in [facility] Office Visit from 07/25/2023 in [facility] Primary Care at [facility] Visit from 07/11/2023 in [facility] at [facility] Total Score 0 5 2 6   PHQ-9 Total Score -- 18  11 24    Flowsheet Row ED from 12/28/2023 in [facility] Emergency Department at [facility] ED from 11/12/2023 in [facility] Emergency Department at  [facility] ED to Hosp-Admission (Discharged) from 11/03/2023 in [facility] 4E CV SURGICAL PROGRESSIVE CARE  C-SSRS RISK CATEGORY No Risk No Risk No Risk     Assessment and Plan: Beckam Abdulaziz 34 year old African-American male presents for medication management follow-up appointment.  Carries a diagnosis related to mood disorder unclassified, generalized anxiety disorder major depressive disorder.  Previously taking Lamictal  125 as he reports self titrated.  Discussed taking lamotrigine  100 mg and continue to monitor symptoms.  He denied concerns at this visit.  Discussed continuing on Lamictal  100 mg as he reports being stable at this dose.  Reports a good appetite.  States he is resting well throughout the night.  Patient to follow-up 3 to 4 months for medication adherence/tolerability. PCP recently collected basic labs: Continue improving with cholesterol levels and lipid panel within normal limits.  Support and encouragement reassurance was provided.  Collaboration of Care: Collaboration of Care: Medication Management AEB continue lamotrigine  100 mg daily follow-up 3 to 4 months - BMP, CBC, A1c and hepatic panel reviewed   Patient/Guardian was advised Release of Information must be obtained prior to any record release in order to collaborate their care with an outside provider. Patient/Guardian was advised if they have not already done so to contact the registration department to sign all necessary forms in order for us  to release information regarding their care.   Consent: Patient/Guardian gives verbal consent for treatment and assignment of benefits for services provided during this visit. Patient/Guardian expressed understanding and agreed to proceed.    Staci LOISE Kerns, NP 03/26/2024, 9:20 AM     [1] No Known Allergies  "

## 2024-06-23 ENCOUNTER — Ambulatory Visit (HOSPITAL_COMMUNITY): Admitting: Family
